# Patient Record
Sex: Male | Born: 1970 | Race: White | Hispanic: No | Marital: Married | Smoking: Never smoker
Health system: Southern US, Community
[De-identification: ages and names within clinical notes are randomized; demographics above are authoritative.]

## PROBLEM LIST (undated history)

## (undated) DIAGNOSIS — I10 Essential (primary) hypertension: Secondary | ICD-10-CM

## (undated) HISTORY — PX: FRACTURE SURGERY: SHX138

---

## 2020-10-17 DIAGNOSIS — I6389 Other cerebral infarction: Secondary | ICD-10-CM

## 2020-10-18 DIAGNOSIS — R001 Bradycardia, unspecified: Secondary | ICD-10-CM

## 2020-10-19 ENCOUNTER — Ambulatory Visit
Admission: RE | Admit: 2020-10-19 | Discharge: 2020-10-19 | Disposition: A | Discharge status: Home / Self Care | Payer: Self-pay | Source: Ambulatory Visit | Attending: Internal Medicine | Admitting: Internal Medicine

## 2020-10-19 ENCOUNTER — Other Ambulatory Visit: Payer: Self-pay | Admitting: Radiation Therapy

## 2020-10-19 DIAGNOSIS — C729 Malignant neoplasm of central nervous system, unspecified: Secondary | ICD-10-CM

## 2020-10-22 ENCOUNTER — Telehealth: Payer: Self-pay | Admitting: Radiation Therapy

## 2020-10-22 NOTE — Telephone Encounter (Signed)
Called regarding our conference discussion this morning. Mr. Joshua Swanson voicemail was full, but I was able to speak with his wife Joshua Swanson about her husband's appointment with Dr. Mickeal Skinner tomorrow afternoon.   She has requested financial assistance and is concerned that their insurance is not accepted by Dr. Mickeal Skinner, [employer].   Mont Dutton R.T.(R)(T) Radiation Special Procedures Navigator

## 2020-10-24 ENCOUNTER — Other Ambulatory Visit: Payer: Self-pay

## 2020-10-24 DIAGNOSIS — U071 COVID-19: Secondary | ICD-10-CM

## 2020-10-25 ENCOUNTER — Inpatient Hospital Stay: Payer: 59 | Attending: Internal Medicine | Admitting: Internal Medicine

## 2020-10-25 ENCOUNTER — Ambulatory Visit (INDEPENDENT_AMBULATORY_CARE_PROVIDER_SITE_OTHER): Payer: 59

## 2020-10-25 DIAGNOSIS — G9389 Other specified disorders of brain: Secondary | ICD-10-CM | POA: Diagnosis not present

## 2020-10-25 DIAGNOSIS — U071 COVID-19: Secondary | ICD-10-CM | POA: Diagnosis not present

## 2020-10-25 MED ORDER — FAMOTIDINE IN NACL 20-0.9 MG/50ML-% IV SOLN: 20.0000 mg | Freq: Once | INTRAVENOUS | Status: AC | PRN

## 2020-10-25 MED ORDER — DIPHENHYDRAMINE HCL 50 MG/ML IJ SOLN: 50.0000 mg | Freq: Once | INTRAMUSCULAR | Status: AC | PRN

## 2020-10-25 MED ORDER — METHYLPREDNISOLONE SODIUM SUCC 125 MG IJ SOLR: 125.0000 mg | Freq: Once | INTRAMUSCULAR | Status: AC | PRN

## 2020-10-25 MED ORDER — ALBUTEROL SULFATE HFA 108 (90 BASE) MCG/ACT IN AERS: 2.0000 | INHALATION_SPRAY | Freq: Once | RESPIRATORY_TRACT | Status: AC | PRN

## 2020-10-25 MED ORDER — SODIUM CHLORIDE 0.9 % IV SOLN: INTRAVENOUS | Status: AC | PRN

## 2020-10-25 MED ORDER — BEBTELOVIMAB 175 MG/2 ML IV (EUA)
175.0000 mg | Freq: Once | INTRAMUSCULAR | Status: AC
  Administered 2020-10-25: 175 mg via INTRAVENOUS

## 2020-10-25 MED ORDER — EPINEPHRINE 0.3 MG/0.3ML IJ SOAJ: 0.3000 mg | Freq: Once | INTRAMUSCULAR | Status: AC | PRN

## 2020-10-25 NOTE — Patient Instructions (Signed)

## 2020-10-25 NOTE — Progress Notes (Signed)
I connected with Joshua Swanson on 10/25/20 at  2:00 PM EDT by telephone visit and verified that I am speaking with the correct person using two identifiers.  I discussed the limitations, risks, security and privacy concerns of performing an evaluation and management service by telemedicine and the availability of in-person appointments. I also discussed with the patient that there may be a patient responsible charge related to this service. The patient expressed understanding and agreed to proceed.  Other persons participating in the visit and their role in the encounter:  spouse  Patient's location:  Home  Provider's location:  Office   Chief Complaint:  Brain mass  History of Present Ilness: Joshua Swanson presented to today's visit via phone because of recent COVID diagnosis (mild symptoms).  He describes 2-3 weeks history of right sided numbness affecting his face, arm, and leg.  He describes no issues with gait, speech, no headaches or seizures.  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  Observations: Language and cognition normal  Assessment and Plan: Brain mass  We discussed case this week in brain tumor board, recommended biopsy over resection or deferral of surgery.  He is in agreement with this plan, and referral will be made for consultation with Dr. Zada Finders.  Will defer any other intevention, including corticosteroids, for time being given lack of motor symptoms.  Follow Up Instructions: Will follow up with him following biopsy, pathology results  I discussed the assessment and treatment plan with the patient.  The patient was provided an opportunity to ask questions and all were answered.  The patient agreed with the plan and demonstrated understanding of the instructions.    The patient was advised to call back or seek an in-person evaluation if the symptoms worsen or if the condition fails to improve as  anticipated.  I provided 5-10 minutes of non-face-to-face time during this enocunter.  Ventura Sellers, MD   I provided 22 minutes of non face-to-face telephone visit time during this encounter, and > 50% was spent counseling as documented under my assessment & plan.

## 2020-10-25 NOTE — Progress Notes (Signed)
Diagnosis: COVID  Provider:  Marshell Garfinkel, MD  Procedure: Infusion  IV Type: Peripheral, IV Location: L Antecubital  Bebtelovimab, Dose: 175 mg  Infusion Start Time: [id number]  Infusion Stop Time: [id number]  Post Infusion IV Care: Observation period completed  Discharge: Condition: Good, Destination: Home . AVS provided to patient.   Performed by:  Paul Dykes, RN

## 2020-11-06 ENCOUNTER — Other Ambulatory Visit: Payer: Self-pay | Admitting: Neurological Surgery

## 2020-11-21 NOTE — Progress Notes (Signed)
Surgical Instructions    Your procedure is scheduled on November 27, 2020.  Report to [facility] Main Entrance "A" at 05:30 A.M., then check in with the Admitting office.  Call this number if you have problems the morning of surgery:  [phone]   If you have any questions prior to your surgery date call [phone]: Open Monday-Friday 8am-4pm   Remember:  Do not eat after midnight the night before your surgery  You may drink clear liquids until 04:30 am the morning of your surgery.   Clear liquids allowed are: Water, Non-Citrus Juices (without pulp), Carbonated Beverages, Clear Tea, Black Coffee ONLY (NO MILK, CREAM OR POWDERED CREAMER of any kind), and Gatorade    Take these medicines the morning of surgery with A SIP OF WATER: NONE  As of today, STOP taking any Aspirin (unless otherwise instructed by your surgeon) Aleve, Naproxen, Ibuprofen, Motrin, Advil, Goody's, BC's, all herbal medications, fish oil, and all vitamins.          Do not wear jewelry Do not wear lotions, powders,colognes, or deodorant. Do not shave 48 hours prior to surgery.  Men may shave face and neck. Do not bring valuables to the hospital.             [facility] is not responsible for any belongings or valuables.  Do NOT Smoke (Tobacco/Vaping)  24 hours prior to your procedure If you use a CPAP at night, you may bring your mask for your overnight stay.   Contacts, glasses, dentures or bridgework may not be worn into surgery, please bring cases for these belongings   For patients admitted to the hospital, discharge time will be determined by your treatment team.   Patients discharged the day of surgery will not be allowed to drive home, and someone needs to stay with them for 24 hours.  NO VISITORS WILL BE ALLOWED IN PRE-OP WHERE PATIENTS GET READY FOR SURGERY.  ONLY 1 SUPPORT PERSON MAY BE PRESENT WHILE YOU ARE IN SURGERY.  IF YOU ARE TO BE ADMITTED, ONCE YOU ARE IN YOUR ROOM YOU WILL BE ALLOWED TWO  (2) VISITORS.  Minor children may have two parents present. Special consideration for safety and communication needs will be reviewed on a case by case basis.  Special instructions:    Oral Hygiene is also important to reduce your risk of infection.  Remember - BRUSH YOUR TEETH THE MORNING OF SURGERY WITH YOUR REGULAR TOOTHPASTE   Joshua Swanson- Preparing For Surgery  Before surgery, you can play an important role. Because skin is not sterile, your skin needs to be as free of germs as possible. You can reduce the number of germs on your skin by washing with CHG (chlorahexidine gluconate) Soap before surgery.  CHG is an antiseptic cleaner which kills germs and bonds with the skin to continue killing germs even after washing.     Please do not use if you have an allergy to CHG or antibacterial soaps. If your skin becomes reddened/irritated stop using the CHG.  Do not shave (including legs and underarms) for at least 48 hours prior to first CHG shower. It is OK to shave your face.  Please follow these instructions carefully.     Shower the NIGHT BEFORE SURGERY and the MORNING OF SURGERY with CHG Soap.   If you chose to wash your hair, wash your hair first as usual with your normal shampoo. After you shampoo, rinse your hair and body thoroughly to remove the shampoo.  Then [employer] and genitals (private parts) with your normal soap and rinse thoroughly to remove soap.  After that Use CHG Soap as you would any other liquid soap. You can apply CHG directly to the skin and wash gently with a scrungie or a clean washcloth.   Apply the CHG Soap to your body ONLY FROM THE NECK DOWN.  Do not use on open wounds or open sores. Avoid contact with your eyes, ears, mouth and genitals (private parts). Wash Face and genitals (private parts)  with your normal soap.   Wash thoroughly, paying special attention to the area where your surgery will be performed.  Thoroughly rinse your body with warm water from the  neck down.  DO NOT shower/wash with your normal soap after using and rinsing off the CHG Soap.  Pat yourself dry with a CLEAN TOWEL.  Wear CLEAN PAJAMAS to bed the night before surgery  Place CLEAN SHEETS on your bed the night before your surgery  DO NOT SLEEP WITH PETS.   Day of Surgery:  Take a shower with CHG soap. Wear Clean/Comfortable clothing the morning of surgery Do not apply any deodorants/lotions.   Remember to brush your teeth WITH YOUR REGULAR TOOTHPASTE.   Please read over the following fact sheets that you were given.

## 2020-11-22 ENCOUNTER — Encounter (HOSPITAL_COMMUNITY): Payer: Self-pay

## 2020-11-22 ENCOUNTER — Encounter (HOSPITAL_COMMUNITY)
Admission: RE | Admit: 2020-11-22 | Discharge: 2020-11-22 | Disposition: A | Discharge status: Home / Self Care | Payer: 59 | Source: Ambulatory Visit | Attending: Neurological Surgery | Admitting: Neurological Surgery

## 2020-11-22 ENCOUNTER — Other Ambulatory Visit: Payer: Self-pay

## 2020-11-22 DIAGNOSIS — Z01818 Encounter for other preprocedural examination: Secondary | ICD-10-CM | POA: Insufficient documentation

## 2020-11-22 HISTORY — DX: Essential (primary) hypertension: I10

## 2020-11-22 LAB — CBC
HCT: 43.7 % (ref 39.0–52.0)
Hemoglobin: 15.3 g/dL (ref 13.0–17.0)
MCH: 31.4 pg (ref 26.0–34.0)
MCHC: 35 g/dL (ref 30.0–36.0)
MCV: 89.5 fL (ref 80.0–100.0)
Platelets: 221 10*3/uL (ref 150–400)
RBC: 4.88 MIL/uL (ref 4.22–5.81)
RDW: 12.5 % (ref 11.5–15.5)
WBC: 7.6 10*3/uL (ref 4.0–10.5)
nRBC: 0 % (ref 0.0–0.2)

## 2020-11-22 LAB — BASIC METABOLIC PANEL
Anion gap: 9 (ref 5–15)
BUN: 10 mg/dL (ref 6–20)
CO2: 22 mmol/L (ref 22–32)
Calcium: 9.3 mg/dL (ref 8.9–10.3)
Chloride: 106 mmol/L (ref 98–111)
Creatinine, Ser: 0.89 mg/dL (ref 0.61–1.24)
GFR, Estimated: >60 mL/min (ref >60)
Glucose, Bld: 109 mg/dL — ABNORMAL HIGH (ref 70–99)
Potassium: 3.8 mmol/L (ref 3.5–5.1)
Sodium: 137 mmol/L (ref 135–145)

## 2020-11-22 LAB — TYPE AND SCREEN
ABO/RH(D): A POS
Antibody Screen: NEGATIVE

## 2020-11-22 NOTE — Progress Notes (Addendum)
PCP - Walker Shadow MD, [facility]. Pt has not seen this doctor yet ,but is scheduled to see him tomorrow.  Cardiologist - none  PPM/ICD -denies Device Orders -  Rep Notified -   Chest x-ray - none EKG - 11/22/20 Stress Test - none ECHO - none Cardiac Cath -none   Sleep Study - no CPAP - no  Fasting Blood Sugar - n/a Checks Blood Sugar _____ times a day  Blood Thinner Instructions:n/a Aspirin Instructions:n/a  ERAS Protcol -yes-clear liquids until 0430 PRE-SURGERY Ensure or G2- no.  COVID TEST- no. Pt tested positive 10/23/20. He was treated at [facility] infusion clinic. See progress notes from 8/24 and 8/25.   Anesthesia review: no  Patient denies shortness of breath, fever, cough and chest pain at PAT appointment   All instructions explained to the patient, with a verbal understanding of the material. Patient agrees to go over the instructions while at home for a better understanding. Patient also instructed to self quarantine after being tested for COVID-19. The opportunity to ask questions was provided.

## 2020-11-26 NOTE — Anesthesia Preprocedure Evaluation (Addendum)
Anesthesia Evaluation  Patient identified by MRN, date of birth, ID band Patient awake    Reviewed: Allergy & Precautions, NPO status , Patient's Chart, lab work & pertinent test results  Airway Mallampati: II  TM Distance: >3 FB Neck ROM: Full    Dental  (+) Dental Advisory Given, Edentulous Upper, Edentulous Lower   Pulmonary neg pulmonary ROS,    Pulmonary exam normal breath sounds clear to auscultation       Cardiovascular hypertension, Pt. on medications negative cardio ROS Normal cardiovascular exam Rhythm:Regular Rate:Normal     Neuro/Psych negative neurological ROS     GI/Hepatic negative GI ROS, Neg liver ROS,   Endo/Other  negative endocrine ROS  Renal/GU negative Renal ROS     Musculoskeletal negative musculoskeletal ROS (+)   Abdominal   Peds  Hematology negative hematology ROS (+)   Anesthesia Other Findings   Reproductive/Obstetrics                            Anesthesia Physical Anesthesia Plan  ASA: 2  Anesthesia Plan: General   Post-op Pain Management:    Induction: Intravenous  PONV Risk Score and Plan: 3 and Ondansetron, Aprepitant, Dexamethasone, Midazolam, Propofol infusion and Treatment may vary due to age or medical condition  Airway Management Planned: Oral ETT  Additional Equipment: Arterial line  Intra-op Plan:   Post-operative Plan: Extubation in OR  Informed Consent: I have reviewed the patients History and Physical, chart, labs and discussed the procedure including the risks, benefits and alternatives for the proposed anesthesia with the patient or authorized representative who has indicated his/her understanding and acceptance.     Dental advisory given  Plan Discussed with: CRNA  Anesthesia Plan Comments: (2 x PIV, remi gtt)       Anesthesia Quick Evaluation

## 2020-11-27 ENCOUNTER — Other Ambulatory Visit: Payer: Self-pay

## 2020-11-27 ENCOUNTER — Inpatient Hospital Stay (HOSPITAL_COMMUNITY): Payer: 59 | Admitting: Certified Registered"

## 2020-11-27 ENCOUNTER — Encounter (HOSPITAL_COMMUNITY)
Admission: RE | Disposition: A | Discharge status: Home / Self Care | Payer: Self-pay | Source: Home / Self Care | Attending: Neurological Surgery

## 2020-11-27 ENCOUNTER — Encounter (HOSPITAL_COMMUNITY): Payer: Self-pay | Admitting: Neurological Surgery

## 2020-11-27 ENCOUNTER — Inpatient Hospital Stay (HOSPITAL_COMMUNITY)
Admission: RE | Admit: 2020-11-27 | Discharge: 2020-12-01 | DRG: 024 | Disposition: A | Discharge status: Home / Self Care | Payer: 59 | Attending: Neurological Surgery | Admitting: Neurological Surgery

## 2020-11-27 DIAGNOSIS — R200 Anesthesia of skin: Secondary | ICD-10-CM | POA: Diagnosis present

## 2020-11-27 DIAGNOSIS — I10 Essential (primary) hypertension: Secondary | ICD-10-CM | POA: Diagnosis present

## 2020-11-27 DIAGNOSIS — D496 Neoplasm of unspecified behavior of brain: Secondary | ICD-10-CM | POA: Diagnosis present

## 2020-11-27 DIAGNOSIS — G0481 Other encephalitis and encephalomyelitis: Secondary | ICD-10-CM | POA: Diagnosis present

## 2020-11-27 DIAGNOSIS — R270 Ataxia, unspecified: Secondary | ICD-10-CM | POA: Diagnosis present

## 2020-11-27 HISTORY — PX: APPLICATION OF CRANIAL NAVIGATION: SHX6578

## 2020-11-27 HISTORY — PX: CRANIOTOMY: SHX93

## 2020-11-27 LAB — CBC
HCT: 37.6 % — ABNORMAL LOW (ref 39.0–52.0)
Hemoglobin: 13.5 g/dL (ref 13.0–17.0)
MCH: 31.7 pg (ref 26.0–34.0)
MCHC: 35.9 g/dL (ref 30.0–36.0)
MCV: 88.3 fL (ref 80.0–100.0)
Platelets: 215 10*3/uL (ref 150–400)
RBC: 4.26 MIL/uL (ref 4.22–5.81)
RDW: 12.3 % (ref 11.5–15.5)
WBC: 9.7 10*3/uL (ref 4.0–10.5)
nRBC: 0 % (ref 0.0–0.2)

## 2020-11-27 LAB — ABO/RH: ABO/RH(D): A POS

## 2020-11-27 LAB — CREATININE, SERUM
Creatinine, Ser: 0.96 mg/dL (ref 0.61–1.24)
GFR, Estimated: >60 mL/min (ref >60)

## 2020-11-27 LAB — MRSA NEXT GEN BY PCR, NASAL: MRSA by PCR Next Gen: NOT DETECTED

## 2020-11-27 SURGERY — CRANIOTOMY TUMOR EXCISION
Anesthesia: General | Site: Head

## 2020-11-27 MED ORDER — PROPOFOL 500 MG/50ML IV EMUL
INTRAVENOUS | Status: DC | PRN
  Administered 2020-11-27: 75 ug/kg/min via INTRAVENOUS
  Administered 2020-11-27: 50 ug/kg/min via INTRAVENOUS

## 2020-11-27 MED ORDER — HEMOSTATIC AGENTS (NO CHARGE) OPTIME
TOPICAL | Status: DC | PRN
  Administered 2020-11-27: 1 via TOPICAL

## 2020-11-27 MED ORDER — BACITRACIN ZINC 500 UNIT/GM EX OINT
TOPICAL_OINTMENT | CUTANEOUS | Status: DC | PRN
  Administered 2020-11-27: 1 via TOPICAL

## 2020-11-27 MED ORDER — CEFAZOLIN SODIUM-DEXTROSE 2-4 GM/100ML-% IV SOLN
2.0000 g | Freq: Three times a day (TID) | INTRAVENOUS | Status: AC
  Administered 2020-11-27 – 2020-11-28 (×2): 2 g via INTRAVENOUS
  Filled 2020-11-27 (×2): qty 100

## 2020-11-27 MED ORDER — DEXAMETHASONE SODIUM PHOSPHATE 10 MG/ML IJ SOLN
INTRAMUSCULAR | Status: DC | PRN
  Administered 2020-11-27: 10 mg via INTRAVENOUS

## 2020-11-27 MED ORDER — EPHEDRINE SULFATE-NACL 50-0.9 MG/10ML-% IV SOSY
PREFILLED_SYRINGE | INTRAVENOUS | Status: DC | PRN
  Administered 2020-11-27: 5 mg via INTRAVENOUS

## 2020-11-27 MED ORDER — 0.9 % SODIUM CHLORIDE (POUR BTL) OPTIME
TOPICAL | Status: DC | PRN
  Administered 2020-11-27: 3000 mL

## 2020-11-27 MED ORDER — HYDRALAZINE HCL 20 MG/ML IJ SOLN
5.0000 mg | INTRAMUSCULAR | Status: DC | PRN
  Administered 2020-11-27: 5 mg via INTRAVENOUS
  Filled 2020-11-27: qty 1

## 2020-11-27 MED ORDER — THROMBIN 20000 UNITS EX SOLR: CUTANEOUS | Status: DC | PRN

## 2020-11-27 MED ORDER — THROMBIN 5000 UNITS EX SOLR
CUTANEOUS | Status: AC
  Filled 2020-11-27: qty 5000

## 2020-11-27 MED ORDER — LIDOCAINE-EPINEPHRINE 1 %-1:100000 IJ SOLN
INTRAMUSCULAR | Status: DC | PRN
  Administered 2020-11-27: 10 mL

## 2020-11-27 MED ORDER — OXYCODONE HCL 5 MG PO TABS
10.0000 mg | ORAL_TABLET | ORAL | Status: DC | PRN
  Administered 2020-11-28 – 2020-11-30 (×5): 10 mg via ORAL
  Filled 2020-11-27 (×5): qty 2

## 2020-11-27 MED ORDER — LIDOCAINE 2% (20 MG/ML) 5 ML SYRINGE
INTRAMUSCULAR | Status: DC | PRN
  Administered 2020-11-27: 50 mg via INTRAVENOUS

## 2020-11-27 MED ORDER — ORAL CARE MOUTH RINSE: 15.0000 mL | Freq: Once | OROMUCOSAL | Status: AC

## 2020-11-27 MED ORDER — CEFAZOLIN SODIUM 1 G IJ SOLR
INTRAMUSCULAR | Status: AC
  Filled 2020-11-27: qty 20

## 2020-11-27 MED ORDER — SUCCINYLCHOLINE CHLORIDE 200 MG/10ML IV SOSY
PREFILLED_SYRINGE | INTRAVENOUS | Status: DC | PRN
  Administered 2020-11-27: 100 mg via INTRAVENOUS

## 2020-11-27 MED ORDER — HYDRALAZINE HCL 20 MG/ML IJ SOLN: 10.0000 mg | INTRAMUSCULAR | Status: DC | PRN

## 2020-11-27 MED ORDER — SODIUM CHLORIDE 0.9 % IV SOLN: INTRAVENOUS | Status: DC | PRN

## 2020-11-27 MED ORDER — MIDAZOLAM HCL 2 MG/2ML IJ SOLN
INTRAMUSCULAR | Status: AC
  Filled 2020-11-27: qty 2

## 2020-11-27 MED ORDER — SODIUM CHLORIDE 0.9 % IV SOLN
0.0500 ug/kg/min | INTRAVENOUS | Status: AC
  Administered 2020-11-27: .2 ug/kg/min via INTRAVENOUS
  Filled 2020-11-27: qty 5000

## 2020-11-27 MED ORDER — AMISULPRIDE (ANTIEMETIC) 5 MG/2ML IV SOLN: 10.0000 mg | Freq: Once | INTRAVENOUS | Status: DC | PRN

## 2020-11-27 MED ORDER — PHENYLEPHRINE 40 MCG/ML (10ML) SYRINGE FOR IV PUSH (FOR BLOOD PRESSURE SUPPORT)
PREFILLED_SYRINGE | INTRAVENOUS | Status: DC | PRN
  Administered 2020-11-27: 40 ug via INTRAVENOUS
  Administered 2020-11-27: 80 ug via INTRAVENOUS

## 2020-11-27 MED ORDER — PHENYLEPHRINE HCL-NACL 20-0.9 MG/250ML-% IV SOLN
INTRAVENOUS | Status: DC | PRN
  Administered 2020-11-27: 25 ug/min via INTRAVENOUS

## 2020-11-27 MED ORDER — CHLORHEXIDINE GLUCONATE 0.12 % MT SOLN
15.0000 mL | Freq: Once | OROMUCOSAL | Status: AC
  Administered 2020-11-27: 15 mL via OROMUCOSAL
  Filled 2020-11-27: qty 15

## 2020-11-27 MED ORDER — ACETAMINOPHEN 325 MG PO TABS
650.0000 mg | ORAL_TABLET | ORAL | Status: DC | PRN
  Administered 2020-11-27: 650 mg via ORAL
  Filled 2020-11-27: qty 2

## 2020-11-27 MED ORDER — HYDROMORPHONE HCL 1 MG/ML IJ SOLN: 0.2500 mg | INTRAMUSCULAR | Status: DC | PRN

## 2020-11-27 MED ORDER — MEPERIDINE HCL 25 MG/ML IJ SOLN: 6.2500 mg | INTRAMUSCULAR | Status: DC | PRN

## 2020-11-27 MED ORDER — GLYCOPYRROLATE PF 0.2 MG/ML IJ SOSY
PREFILLED_SYRINGE | INTRAMUSCULAR | Status: AC
  Filled 2020-11-27: qty 1

## 2020-11-27 MED ORDER — LACTATED RINGERS IV SOLN: INTRAVENOUS | Status: DC

## 2020-11-27 MED ORDER — THROMBIN 20000 UNITS EX SOLR
CUTANEOUS | Status: AC
  Filled 2020-11-27: qty 20000

## 2020-11-27 MED ORDER — DOCUSATE SODIUM 100 MG PO CAPS
100.0000 mg | ORAL_CAPSULE | Freq: Two times a day (BID) | ORAL | Status: DC
  Administered 2020-11-27 – 2020-12-01 (×9): 100 mg via ORAL
  Filled 2020-11-27 (×7): qty 1

## 2020-11-27 MED ORDER — LIDOCAINE-EPINEPHRINE 1 %-1:100000 IJ SOLN
INTRAMUSCULAR | Status: AC
  Filled 2020-11-27: qty 1

## 2020-11-27 MED ORDER — THROMBIN 5000 UNITS EX SOLR: OROMUCOSAL | Status: DC | PRN

## 2020-11-27 MED ORDER — ONDANSETRON HCL 4 MG/2ML IJ SOLN
4.0000 mg | INTRAMUSCULAR | Status: DC | PRN
  Administered 2020-11-28: 4 mg via INTRAVENOUS
  Filled 2020-11-27: qty 2

## 2020-11-27 MED ORDER — APREPITANT 40 MG PO CAPS
40.0000 mg | ORAL_CAPSULE | Freq: Once | ORAL | Status: AC
  Administered 2020-11-27: 40 mg via ORAL
  Filled 2020-11-27: qty 1

## 2020-11-27 MED ORDER — PROPOFOL 10 MG/ML IV BOLUS
INTRAVENOUS | Status: AC
  Filled 2020-11-27: qty 40

## 2020-11-27 MED ORDER — ACETAMINOPHEN 650 MG RE SUPP: 650.0000 mg | RECTAL | Status: DC | PRN

## 2020-11-27 MED ORDER — CEFAZOLIN SODIUM-DEXTROSE 2-4 GM/100ML-% IV SOLN
2.0000 g | INTRAVENOUS | Status: AC
  Administered 2020-11-27 (×2): 2 g via INTRAVENOUS
  Filled 2020-11-27: qty 100

## 2020-11-27 MED ORDER — POLYETHYLENE GLYCOL 3350 17 G PO PACK: 17.0000 g | PACK | Freq: Every day | ORAL | Status: DC | PRN

## 2020-11-27 MED ORDER — MANNITOL 25 % IV SOLN
INTRAVENOUS | Status: DC | PRN
  Administered 2020-11-27: 37.5 g via INTRAVENOUS

## 2020-11-27 MED ORDER — LOSARTAN POTASSIUM 50 MG PO TABS
50.0000 mg | ORAL_TABLET | Freq: Every day | ORAL | Status: DC
  Administered 2020-11-27 – 2020-12-01 (×5): 50 mg via ORAL
  Filled 2020-11-27 (×5): qty 1

## 2020-11-27 MED ORDER — FENTANYL CITRATE (PF) 250 MCG/5ML IJ SOLN
INTRAMUSCULAR | Status: DC | PRN
  Administered 2020-11-27 (×2): 50 ug via INTRAVENOUS

## 2020-11-27 MED ORDER — CHLORHEXIDINE GLUCONATE CLOTH 2 % EX PADS
6.0000 | MEDICATED_PAD | Freq: Every day | CUTANEOUS | Status: DC
  Administered 2020-11-29: 6 via TOPICAL

## 2020-11-27 MED ORDER — ONDANSETRON HCL 4 MG PO TABS: 4.0000 mg | ORAL_TABLET | ORAL | Status: DC | PRN

## 2020-11-27 MED ORDER — PROPOFOL 10 MG/ML IV BOLUS
INTRAVENOUS | Status: DC | PRN
  Administered 2020-11-27: 40 mg via INTRAVENOUS
  Administered 2020-11-27: 160 mg via INTRAVENOUS
  Administered 2020-11-27: 40 mg via INTRAVENOUS
  Administered 2020-11-27: 50 mg via INTRAVENOUS
  Administered 2020-11-27: 60 mg via INTRAVENOUS

## 2020-11-27 MED ORDER — CHLORHEXIDINE GLUCONATE CLOTH 2 % EX PADS: 6.0000 | MEDICATED_PAD | Freq: Once | CUTANEOUS | Status: DC

## 2020-11-27 MED ORDER — FENTANYL CITRATE (PF) 250 MCG/5ML IJ SOLN
INTRAMUSCULAR | Status: AC
  Filled 2020-11-27: qty 5

## 2020-11-27 MED ORDER — LIDOCAINE HCL (PF) 2 % IJ SOLN
INTRAMUSCULAR | Status: AC
  Filled 2020-11-27: qty 5

## 2020-11-27 MED ORDER — PROMETHAZINE HCL 25 MG PO TABS
12.5000 mg | ORAL_TABLET | ORAL | Status: DC | PRN
  Filled 2020-11-27: qty 1

## 2020-11-27 MED ORDER — OXYCODONE HCL 5 MG PO TABS
5.0000 mg | ORAL_TABLET | ORAL | Status: DC | PRN
  Administered 2020-11-27 – 2020-12-01 (×10): 5 mg via ORAL
  Filled 2020-11-27 (×10): qty 1

## 2020-11-27 MED ORDER — LABETALOL HCL 5 MG/ML IV SOLN
10.0000 mg | INTRAVENOUS | Status: DC | PRN
  Administered 2020-11-27 – 2020-11-29 (×3): 20 mg via INTRAVENOUS
  Filled 2020-11-27: qty 8
  Filled 2020-11-27: qty 4

## 2020-11-27 MED ORDER — PROPOFOL 1000 MG/100ML IV EMUL
INTRAVENOUS | Status: AC
  Filled 2020-11-27: qty 200

## 2020-11-27 MED ORDER — ONDANSETRON HCL 4 MG/2ML IJ SOLN
INTRAMUSCULAR | Status: DC | PRN
  Administered 2020-11-27: 4 mg via INTRAVENOUS

## 2020-11-27 MED ORDER — HYDROMORPHONE HCL 1 MG/ML IJ SOLN
0.5000 mg | INTRAMUSCULAR | Status: DC | PRN
  Administered 2020-11-27 – 2020-11-28 (×3): 0.5 mg via INTRAVENOUS
  Filled 2020-11-27 (×3): qty 1

## 2020-11-27 MED ORDER — BACITRACIN ZINC 500 UNIT/GM EX OINT
TOPICAL_OINTMENT | CUTANEOUS | Status: AC
  Filled 2020-11-27: qty 28.35

## 2020-11-27 MED ORDER — HEPARIN SODIUM (PORCINE) 5000 UNIT/ML IJ SOLN
5000.0000 [IU] | Freq: Three times a day (TID) | INTRAMUSCULAR | Status: DC
  Administered 2020-11-29 – 2020-12-01 (×7): 5000 [IU] via SUBCUTANEOUS
  Filled 2020-11-27 (×6): qty 1

## 2020-11-27 MED ORDER — GLYCOPYRROLATE PF 0.2 MG/ML IJ SOSY
PREFILLED_SYRINGE | INTRAMUSCULAR | Status: DC | PRN
  Administered 2020-11-27 (×2): .2 mg via INTRAVENOUS

## 2020-11-27 SURGICAL SUPPLY — 69 items
BAG COUNTER SPONGE SURGICOUNT (BAG) ×3 IMPLANT
BAND RUBBER #18 3X1/16 STRL (MISCELLANEOUS) ×6 IMPLANT
BNDG GAUZE ELAST 4 BULKY (GAUZE/BANDAGES/DRESSINGS) IMPLANT
BNDG STRETCH 4X75 STRL LF (GAUZE/BANDAGES/DRESSINGS) IMPLANT
BUR ACORN 9.0 PRECISION (BURR) ×3 IMPLANT
BUR ROUND FLUTED 4 SOFT TCH (BURR) ×3 IMPLANT
BUR SPIRAL ROUTER 2.3 (BUR) ×3 IMPLANT
CANISTER SUCT 3000ML PPV (MISCELLANEOUS) ×6 IMPLANT
CATH FOLEY 2WAY SLVR  5CC 12FR (CATHETERS) ×1
CATH FOLEY 2WAY SLVR 5CC 12FR (CATHETERS) ×2 IMPLANT
CNTNR URN SCR LID CUP LEK RST (MISCELLANEOUS) ×2 IMPLANT
CONT SPEC 4OZ STRL OR WHT (MISCELLANEOUS) ×1
DRAPE HALF SHEET 40X57 (DRAPES) ×3 IMPLANT
DRAPE MICROSCOPE LEICA (MISCELLANEOUS) ×3 IMPLANT
DRAPE NEUROLOGICAL W/INCISE (DRAPES) ×3 IMPLANT
DRAPE WARM FLUID 44X44 (DRAPES) ×3 IMPLANT
DURAPREP 6ML APPLICATOR 50/CS (WOUND CARE) ×3 IMPLANT
ELECT REM PT RETURN 9FT ADLT (ELECTROSURGICAL) ×3
ELECTRODE REM PT RTRN 9FT ADLT (ELECTROSURGICAL) ×2 IMPLANT
FEE INTRAOP CADWELL SUPPLY NCS (MISCELLANEOUS) ×2 IMPLANT
FEE INTRAOP MONITOR IMPULS NCS (MISCELLANEOUS) ×2 IMPLANT
FORCEPS BIPOLAR SPETZLER 8 1.0 (NEUROSURGERY SUPPLIES) ×3 IMPLANT
GAUZE 4X4 16PLY ~~LOC~~+RFID DBL (SPONGE) ×6 IMPLANT
GLOVE SS BIOGEL STRL SZ 7.5 (GLOVE) ×2 IMPLANT
GLOVE SUPERSENSE BIOGEL SZ 7.5 (GLOVE) ×1
GLOVE SURG ENC MOIS LTX SZ7 (GLOVE) ×6 IMPLANT
GLOVE SURG LTX SZ7.5 (GLOVE) ×6 IMPLANT
GLOVE SURG PR MICRO ENCORE 7.5 (GLOVE) ×6 IMPLANT
GLOVE SURG UNDER POLY LF SZ7 (GLOVE) ×6 IMPLANT
GLOVE SURG UNDER POLY LF SZ7.5 (GLOVE) ×6 IMPLANT
GOWN STRL REUS W/ TWL LRG LVL3 (GOWN DISPOSABLE) ×4 IMPLANT
GOWN STRL REUS W/ TWL XL LVL3 (GOWN DISPOSABLE) IMPLANT
GOWN STRL REUS W/TWL 2XL LVL3 (GOWN DISPOSABLE) ×6 IMPLANT
GOWN STRL REUS W/TWL LRG LVL3 (GOWN DISPOSABLE) ×2
GOWN STRL REUS W/TWL XL LVL3 (GOWN DISPOSABLE)
HEMOSTAT POWDER KIT SURGIFOAM (HEMOSTASIS) ×3 IMPLANT
HEMOSTAT SURGICEL 2X14 (HEMOSTASIS) ×3 IMPLANT
INTRAOP CADWELL SUPPLY FEE NCS (MISCELLANEOUS) ×2
INTRAOP DISP SUPPLY FEE NCS (MISCELLANEOUS) ×1
INTRAOP MONITOR FEE IMPULS NCS (MISCELLANEOUS) ×2
INTRAOP MONITOR FEE IMPULSE (MISCELLANEOUS) ×1
KIT BASIN OR (CUSTOM PROCEDURE TRAY) ×3 IMPLANT
KIT TURNOVER KIT B (KITS) ×3 IMPLANT
MARKER SPHERE PSV REFLC 13MM (MARKER) ×9 IMPLANT
NEEDLE HYPO 22GX1.5 SAFETY (NEEDLE) ×3 IMPLANT
NS IRRIG 1000ML POUR BTL (IV SOLUTION) ×9 IMPLANT
PACK CRANIOTOMY CUSTOM (CUSTOM PROCEDURE TRAY) ×3 IMPLANT
PIN MAYFIELD SKULL DISP (PIN) ×3 IMPLANT
PLATE MALL UNIV 0.3 (Plate) ×3 IMPLANT
PROBE FOR NEUROSURGERY (MISCELLANEOUS) ×3 IMPLANT
SCREW UNIII AXS SD 1.5X4 (Screw) ×15 IMPLANT
SPONGE NEURO XRAY DETECT 1X3 (DISPOSABLE) IMPLANT
SPONGE SURGIFOAM ABS GEL 100 (HEMOSTASIS) ×3 IMPLANT
STAPLER VISISTAT 35W (STAPLE) ×3 IMPLANT
SUT ETHILON 3 0 FSL (SUTURE) IMPLANT
SUT ETHILON 3 0 PS 1 (SUTURE) IMPLANT
SUT MNCRL AB 3-0 PS2 18 (SUTURE) IMPLANT
SUT MON AB 3-0 SH 27 (SUTURE) ×1
SUT MON AB 3-0 SH27 (SUTURE) ×2 IMPLANT
SUT NURALON 4 0 TR CR/8 (SUTURE) ×3 IMPLANT
SUT SILK 0 TIES 10X30 (SUTURE) IMPLANT
SUT VIC AB 2-0 CP2 18 (SUTURE) ×3 IMPLANT
TOWEL GREEN STERILE (TOWEL DISPOSABLE) ×3 IMPLANT
TOWEL GREEN STERILE FF (TOWEL DISPOSABLE) ×3 IMPLANT
TRAY FOLEY MTR SLVR 16FR STAT (SET/KITS/TRAYS/PACK) ×3 IMPLANT
TUBE CONNECTING 12X1/4 (SUCTIONS) ×3 IMPLANT
TUBE ENDOTRAC NIMS EEG 6 (NEUROSURGERY SUPPLIES) ×1
TUBE ENDOTRAC NIMS EEG 6 NCS (NEUROSURGERY SUPPLIES) ×2 IMPLANT
WATER STERILE IRR 1000ML POUR (IV SOLUTION) ×3 IMPLANT

## 2020-11-27 NOTE — Anesthesia Postprocedure Evaluation (Signed)
Anesthesia Post Note  Patient: Joshua Swanson  Procedure(s) Performed: Left Far Lateral Craniotomy for Tumor Resection (Left: Head) APPLICATION OF CRANIAL NAVIGATION     Patient location during evaluation: PACU Anesthesia Type: General Level of consciousness: sedated and patient cooperative Pain management: pain level controlled Vital Signs Assessment: post-procedure vital signs reviewed and stable Respiratory status: spontaneous breathing Cardiovascular status: stable Anesthetic complications: no   No notable events documented.  Last Vitals:  Vitals:   11/27/20 1500 11/27/20 1600  BP: (!) 154/96 (!) 152/90  Pulse: 82 73  Resp: 18 15  Temp:    SpO2: 93% 94%    Last Pain:  Vitals:   11/27/20 1600  TempSrc:   PainSc: [place]

## 2020-11-27 NOTE — Anesthesia Procedure Notes (Signed)
Procedure Name: Intubation Date/Time: 11/27/2020 7:53 AM Performed by: Gaylene Brooks, CRNA Pre-anesthesia Checklist: Patient identified, Emergency Drugs available, Suction available, Patient being monitored and Timeout performed Patient Re-evaluated:Patient Re-evaluated prior to induction Oxygen Delivery Method: Circle system utilized Preoxygenation: Pre-oxygenation with 100% oxygen Induction Type: IV induction Ventilation: Mask ventilation without difficulty Laryngoscope Size: Miller and 2 Grade View: Grade I Tube size: 7.5 mm Number of attempts: 1 Placement Confirmation: ETT inserted through vocal cords under direct vision, positive ETCO2, CO2 detector and breath sounds checked- equal and bilateral Secured at: 21 cm Dental Injury: Teeth and Oropharynx as per pre-operative assessment  Comments: NIMS tube

## 2020-11-27 NOTE — Op Note (Signed)
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 Further workup did not provide a diagnosis, I therefore recommended open biopsy via a far lateral craniotomy to obtain a diagnosis and guide treatment. We discussed the high risks of the procedure at length, especially given the location of the tumor in the brainstem. We also discussed that I would resect it if possible, but this was unlikely and the goal was to get a diagnosis, which may still be quite difficult. This was discussed with the patient as well as the usual risks, benefits, and alternatives and wished to proceed with surgery.   OPERATIVE DETAIL: The patient was taken to the operating room and placed on the OR table.  A formal time out was performed with two patient identifiers and confirmed the operative site. Anesthesia was induced by the anesthesia team. The patient was placed in a sloppy lateral position with a bean bag and taping to secure him to the OR table. The [place] head holder was applied to the head and a registration array was attached to the [place]. This was co-registered with the patient's preoperative imaging, the fit appeared to be acceptable. Using frameless stereotaxy, the operative trajectory was planned and the incision was marked. Hair was clipped with surgical clippers over the incision and the area was then prepped and draped in a sterile fashion.  A curvilinear  'lazy S' incision was used and a standard far lateral approach was performed on the left. Intra-operative doppler was used to confirm the location of the vertebral artery, which was protected. A far lateral craniectomy was created with high speed drill and rongeurs followed by a C1 hemilaminectomy. After removing a small portion of bone leading up to the condyle, the dura was incised and flapped laterally in the usual fashion.   The operating microscope was draped and brought into the field. We had been monitoring lower cranial nerves, SSEPs, and MEPs during the case and repeated them now as a new baseline. I dissected the arachnoid and created a dissection window to access the lateral medulla. I confirmed the location of the tumor with stereotactic navigation. I attempted to use a Rhoton dissector to comb in line with the fibers, but the tissue was gliotic and too firm. I therefore used a #11 blade, parallel to the fibers, and incised the medulla. I was able to dissect what appeared to be a plane ventrally, then remove a few very small pieces of tissue. Given their size and the suspected pathology, I was concerned this would not be a diagnostic sample. Monitoring was at baseline, so I removed some more in the same location. There was a significant decrement in MEPs after the second biopsy, they thankfully slowly returned. This was indicative to me that no further tissue could be removed safely. I therefore placed some floseal over the biopsy location, sent the specimens to pathology and began closure.  The wound was copiously irrigated, hemostasis was confirmed, and  the dura was closed with suture followed by a layer of gelfoam and then a titanium plate secured with titanium screws.   All instrument and sponge counts were correct, the incision was then closed in layers. The patient was then returned to anesthesia for emergence. No apparent complications at the completion of the procedure.   EBL:  220mL    DRAINS: none   SPECIMENS: Medullary brain tumor   Judith Part, MD 11/27/20 7:34 AM

## 2020-11-27 NOTE — H&P (Signed)
Surgical H&P Update  HPI: 49 y.o. man that presented with right sided numbness. This has improved somewhat with the steroids, his right upper extremity ataxia has improved but not yet back to baseline - still awkward with writing and using that hand - he's R hand dominant. 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 No changes in health since he was last seen.   PMHx:  Past Medical History:  Diagnosis Date   Hypertension    FamHx: History reviewed. No pertinent family history. SocHx:  reports that he has never smoked. He has never used smokeless tobacco. No history on file for alcohol use and drug use.  Physical Exam: AOx3, PERRL, FS, TM  Strength 5/5 x4, SILT except right face / arm / leg numbness, some RUE appendicular ataxia  Assesment/Plan: 50 y.o. man with intramedullary brainstem lesion, here for left far lateral craniotomy for biopsy and possible resection. Risks, benefits, and alternatives discussed and the patient would like to continue with surgery.  -OR today -4N ICU post-op  Judith Part, MD 11/27/20 7:13 AM

## 2020-11-27 NOTE — Anesthesia Procedure Notes (Signed)
Arterial Line Insertion Start/End9/27/2022 6:55 AM, 11/27/2020 7:10 AM Performed by: Gaylene Brooks, CRNA  Preanesthetic checklist: patient identified, IV checked, site marked, risks and benefits discussed, surgical consent, monitors and equipment checked, pre-op evaluation, timeout performed and anesthesia consent Right was placed Catheter size: 20 G Hand hygiene performed  and maximum sterile barriers used  Allen's test indicative of satisfactory collateral circulation Attempts: 1 Procedure performed without using ultrasound guided technique. Following insertion, Biopatch and dressing applied. Post procedure assessment: normal  Patient tolerated the procedure well with no immediate complications.

## 2020-11-27 NOTE — Transfer of Care (Signed)
Immediate Anesthesia Transfer of Care Note  Patient: [name]  Procedure(s) Performed: Left Far Lateral Craniotomy for Tumor Resection (Left: Head) APPLICATION OF CRANIAL NAVIGATION  Patient Location: PACU  Anesthesia Type:General  Level of Consciousness: awake and drowsy  Airway & Oxygen Therapy: Patient Spontanous Breathing and Patient connected to face mask oxygen  Post-op Assessment: Report given to RN and Post -op Vital signs reviewed and stable  Post vital signs: Reviewed and stable  Last Vitals:  Vitals Value Taken Time  BP 147/81 11/27/20 1339  Temp    Pulse 91 11/27/20 1343  Resp 25 11/27/20 1343  SpO2 97 % 11/27/20 1343  Vitals shown include unvalidated device data.  Last Pain:  Vitals:   11/27/20 0607  TempSrc:   PainSc: 0-No pain         Complications: No notable events documented.

## 2020-11-28 ENCOUNTER — Inpatient Hospital Stay (HOSPITAL_COMMUNITY): Payer: 59

## 2020-11-28 IMAGING — MR MR HEAD WO/W CM
7 of 14 series · 24 of 48 positions shown · IV contrast (gadavist)
Comparison: [DATE]

CLINICAL DATA: Brain mass.  Open biopsy.

EXAM:
MRI HEAD WITHOUT AND WITH CONTRAST
TECHNIQUE: Multiplanar, multiecho pulse sequences of the brain and surrounding
structures were obtained without and with intravenous contrast.
CONTRAST:  10mL GADAVIST GADOBUTROL 1 MMOL/ML IV SOLN

[Series 3: DWI · axial · 3.0mm · 0.94mm/px · z∈[-34,+126]mm · 8 of 110 slices shown (1 of 2)]
[im 1/110]
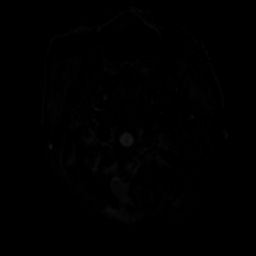
[im 16/110]
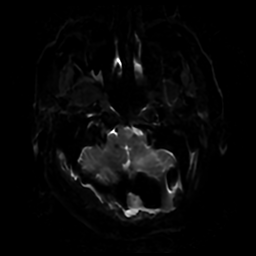
[im 32/110]
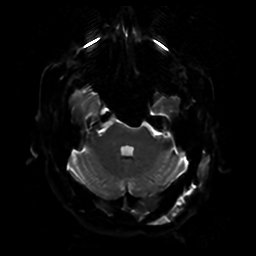
[im 47/110]
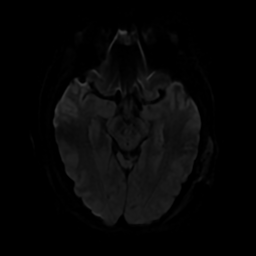
[im 63/110]
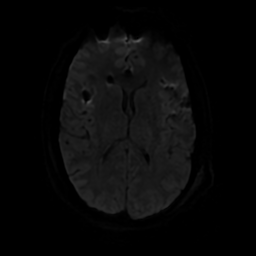
[im 78/110]
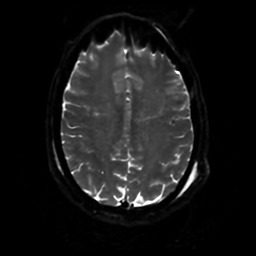
[im 94/110]
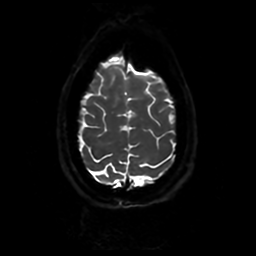
[im 110/110]
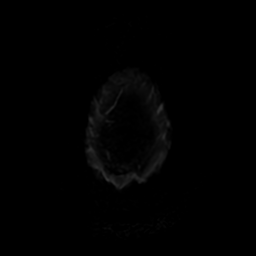

[Series 4: DWI · coronal · 4.0mm · 0.94mm/px · 5 of 78 slices shown (2 of 2)]
[im 1/78]
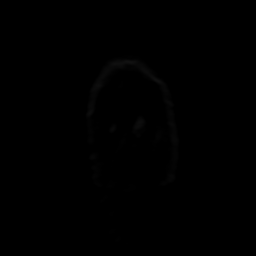
[im 20/78]
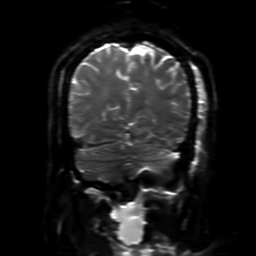
[im 39/78]
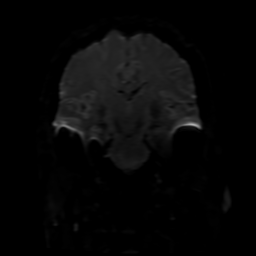
[im 58/78]
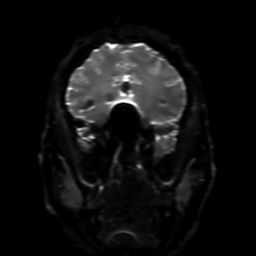
[im 78/78]
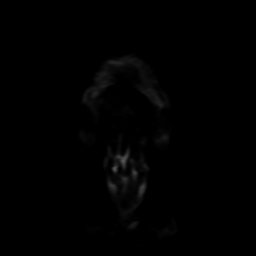

[Series 5: FLAIR · sagittal · 5.0mm · 0.23mm/px · 2 of 25 slices shown (1 of 2)]
[im 1/25]
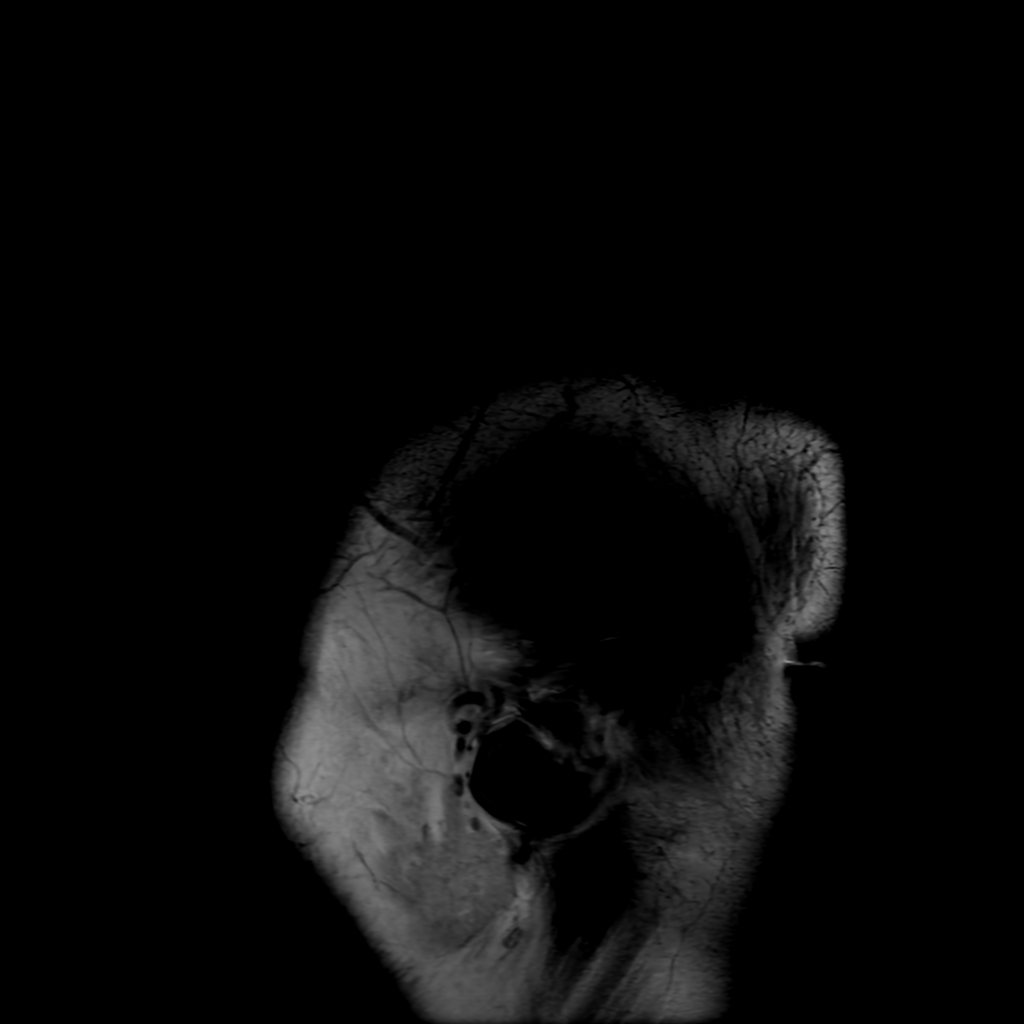
[im 25/25]
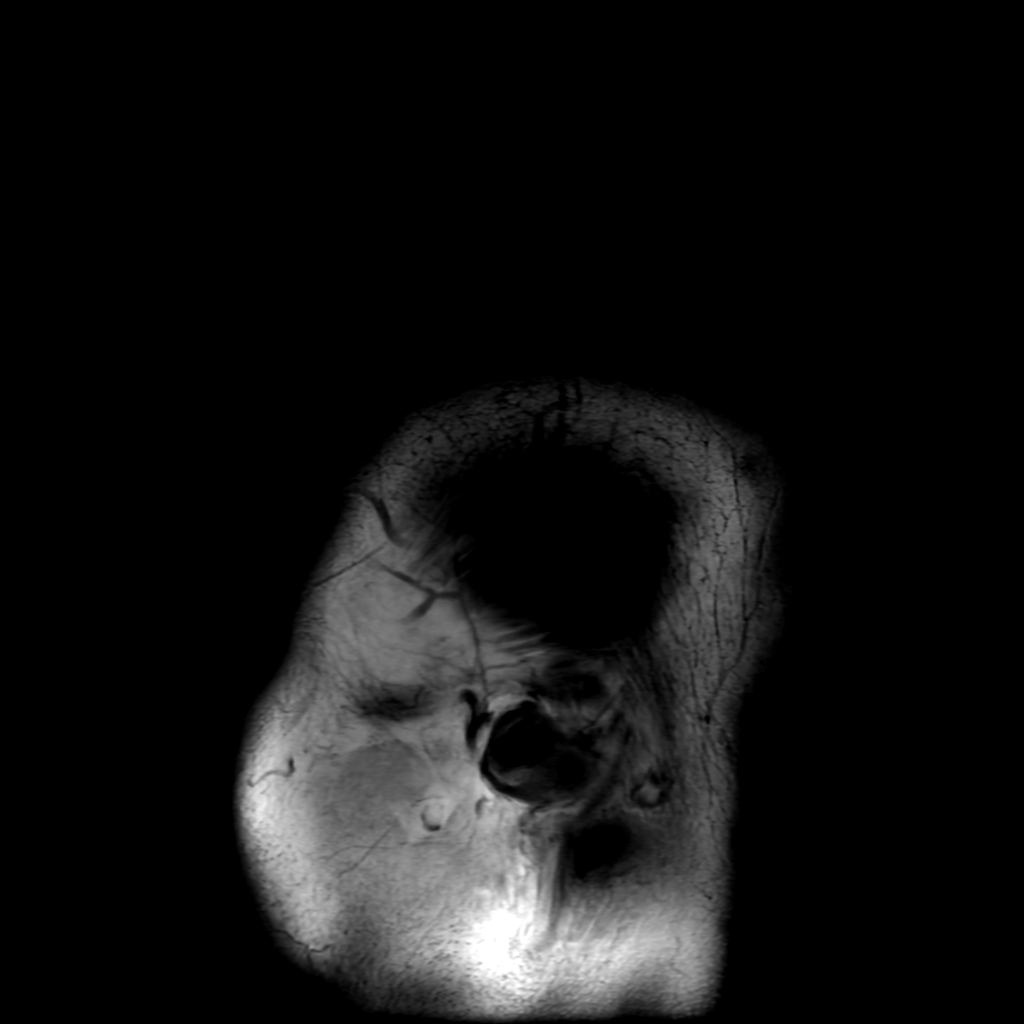

[Series 7: FLAIR · axial · 4.0mm · 0.45mm/px · z∈[-25,+130]mm · 2 of 37 slices shown (2 of 2)]
[im 1/37]
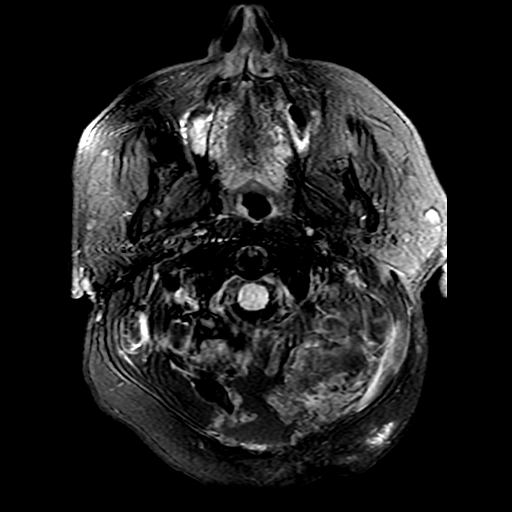
[im 37/37]
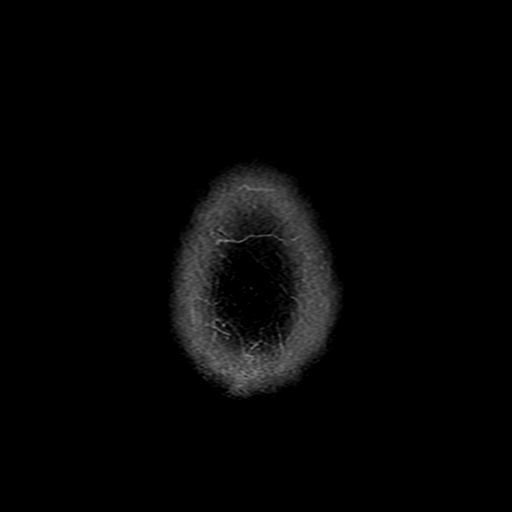

[Series 13: FLAIR post-contrast · sagittal · 5.0mm · 0.47mm/px · 2 of 27 slices shown]
[im 1/27]
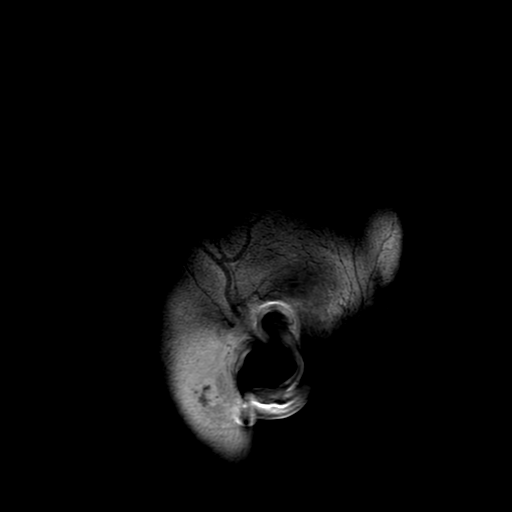
[im 27/27]
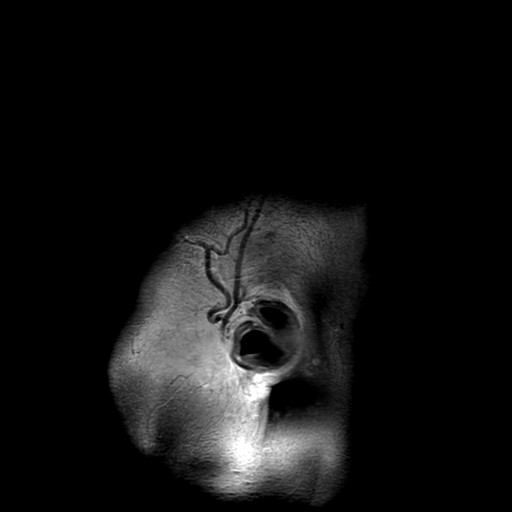

[Series 350: ADC · axial · 3.0mm · 0.94mm/px · z∈[-34,+126]mm · 3 of 55 slices shown (1 of 2)]
[im 1/55]
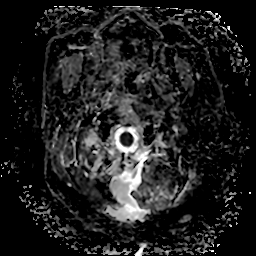
[im 28/55]
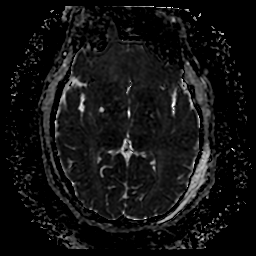
[im 55/55]
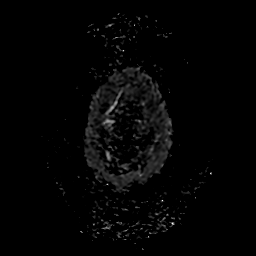

[Series 450: ADC · coronal · 4.0mm · 0.94mm/px · 2 of 39 slices shown (2 of 2)]
[im 1/39]
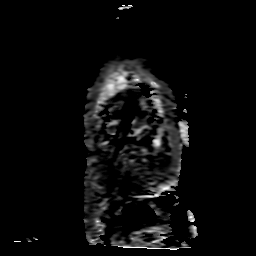
[im 39/39]
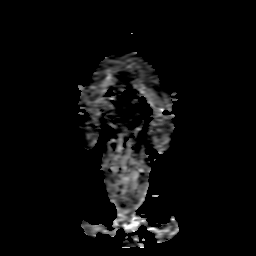

[24 of 48 positions shown; findings below may reference images not displayed]

FINDINGS: Brain:

Probable trace hemorrhage at the level of the left medullary lesion.
No complicating infarct. There has been decreased mass effect and
minimal if any enhancement, atypical for a neoplasm. An inflammatory
process (such as sarcoid), or infectious process are considered
given this unusual evolution.

Small subdural collection behind the clivus. No unexpected blood
products. Pneumocephalus along sulci and around the frontal lobes.

No hydrocephalus or brain sagging.

Dilated perivascular spaces seen in the cerebral white matter on the
right.

Vascular: Preserved flow voids and vascular enhancements, including
in the left vertebral.

Skull and upper cervical spine: Left suboccipital craniotomy with
fairly extensive adjacent fluid posterior to the bone flap.

Sinuses/Orbits: Negative
IMPRESSION: 1. Brainstem biopsy without complicating features.
2. Unexpected decrease (for neoplasm) in enhancement and swelling
since preoperative MRI, has there been any pre-surgical intervention
such as steroids? Question primary inflammatory process, even
sarcoid.

## 2020-11-28 MED ORDER — GADOBUTROL 1 MMOL/ML IV SOLN
10.0000 mL | Freq: Once | INTRAVENOUS | Status: AC | PRN
  Administered 2020-11-28: 10 mL via INTRAVENOUS

## 2020-11-28 NOTE — Evaluation (Signed)
Occupational Therapy Evaluation Patient Details Name: Joshua Swanson MRN: [id number] DOB: [birth year] Today's Date: 11/28/2020   History of Present Illness 50 yo male s/p far lateral tumor biopsy and resection by Dr Zada Finders 11/27/20 PMH HTN   Clinical Impression   Patient is s/p tumor biopsy s/p L craniotomy on 11/27/20 surgery resulting in functional limitations due to the deficits listed below (see OT problem list). Pt currently requires (A) for balance with adls progressing well toward d/c home. Pt could benefit from increased mobility from staff throughout the day. Patient will benefit from skilled OT acutely to increase independence and safety with ADLS to allow discharge home.       Recommendations for follow up therapy are one component of a multi-disciplinary discharge planning process, led by the attending physician.  Recommendations may be updated based on patient status, additional functional criteria and insurance authorization.   Follow Up Recommendations  No OT follow up    Equipment Recommendations  3 in 1 bedside commode;Other (comment) (urinal)    Recommendations for Other Services       Precautions / Restrictions Precautions Precautions: Fall Precaution Comments: aline R hand      Mobility Bed Mobility Overal bed mobility: Needs Assistance Bed Mobility: Rolling;Supine to Sit Rolling: Min assist   Supine to sit: +2 for physical assistance;Min assist     General bed mobility comments: exiting the bed toward the Right side. pt with (A) to power up from bed surface with trunk. pt static sitting supervision    Transfers Overall transfer level: Needs assistance Equipment used: 1 person hand held assist Transfers: Sit to/from Stand Sit to Stand: +2 physical assistance;Min assist         General transfer comment: initially 2 person (A) but progressed to 1 person (A) after attmepting task.    Balance Overall balance assessment: Mild deficits observed, not  formally tested                                         ADL either performed or assessed with clinical judgement   ADL Overall ADL's : Needs assistance/impaired Eating/Feeding: Modified independent;Sitting   Grooming: Modified independent;Sitting Grooming Details (indicate cue type and reason): washing face Upper Body Bathing: Modified independent;Sitting   Lower Body Bathing: Moderate assistance;Sit to/from stand   Upper Body Dressing : Minimal assistance   Lower Body Dressing: Moderate assistance;Sit to/from stand   Toilet Transfer: Minimal Buyer, retail Details (indicate cue type and reason): steady assistance         Functional mobility during ADLs: Minimal assistance General ADL Comments: pt initially two person (A) with power up from bed but progressed to 1 person (A) during session. pt guarding with Aline in R hand at this time.     Vision Baseline Vision/History: 1 Wears glasses Ability to See in Adequate Light: 1 Impaired Vision Assessment?: No apparent visual deficits     Perception     Praxis      Pertinent Vitals/Pain Pain Assessment: 0-10 Pain Score: 4  Pain Location: neck Pain Descriptors / Indicators: Aching Pain Intervention(s): Monitored during session;Premedicated before session;Repositioned     Hand Dominance Right   Extremity/Trunk Assessment Upper Extremity Assessment Upper Extremity Assessment: Overall WFL for tasks assessed (denies any changes or numbness at htis time)   Lower Extremity Assessment Lower Extremity Assessment: Defer to PT evaluation   Cervical / Trunk Assessment  Cervical / Trunk Assessment: Other exceptions (s/p surgery)   Communication Communication Communication: No difficulties   Cognition Arousal/Alertness: Awake/alert Behavior During Therapy: WFL for tasks assessed/performed Overall Cognitive Status: Within Functional Limits for tasks assessed                                      General Comments  VSS    Exercises     Shoulder Instructions      Home Living Family/patient expects to be discharged to:: Private residence Living Arrangements: Spouse/significant other;Children   Type of Home: Mobile home Home Access: Stairs to enter Technical brewer of Steps: 3 Entrance Stairs-Rails: Left;Right Home Layout: One level     Bathroom Shower/Tub: Occupational psychologist: [place]: None   Additional Comments: kids 76 and 42, x2 dogs zeus and tremble      Prior Functioning/Environment Level of Independence: Independent        Comments: normally worked Scientist, research (life sciences) and recieving , playing video games        OT Problem List: Decreased activity tolerance;Impaired balance (sitting and/or standing);Decreased coordination;Decreased knowledge of use of DME or AE;Decreased knowledge of precautions;Pain      OT Treatment/Interventions: Self-care/ADL training;Neuromuscular education;Therapeutic exercise;Energy conservation;DME and/or AE instruction;Therapeutic activities;Patient/family education;Balance training    OT Goals(Current goals can be found in the care plan section) Acute Rehab OT Goals Patient Stated Goal: to return home OT Goal Formulation: With patient Time For Goal Achievement: 12/12/20 Potential to Achieve Goals: Good  OT Frequency: Min 2X/week   Barriers to D/C:            Co-evaluation PT/OT/SLP Co-Evaluation/Treatment: Yes Reason for Co-Treatment: For patient/therapist safety;To address functional/ADL transfers   OT goals addressed during session: ADL's and self-care;Proper use of Adaptive equipment and DME;Strengthening/ROM      AM-PAC OT "6 Clicks" Daily Activity     Outcome Measure Help from another person eating meals?: None Help from another person taking care of personal grooming?: None Help from another person toileting, which includes using toliet, bedpan, or  urinal?: A Little Help from another person bathing (including washing, rinsing, drying)?: A Little Help from another person to put on and taking off regular upper body clothing?: None Help from another person to put on and taking off regular lower body clothing?: A Little 6 Click Score: 21   End of Session Nurse Communication: Mobility status;Precautions  Activity Tolerance: Patient tolerated treatment well Patient left: in chair;with call bell/phone within reach;with chair alarm set  OT Visit Diagnosis: Unsteadiness on feet (R26.81);Muscle weakness (generalized) (M62.81);Pain                Time: 0165-5374 OT Time Calculation (min): 21 min Charges:  OT General Charges $OT Visit: 1 Visit OT Evaluation $OT Eval Moderate Complexity: 1 Mod   Brynn, OTR/L  Acute Rehabilitation Services Pager: [phone] Office: [phone] .   Jeri Modena 11/28/2020, 11:01 AM

## 2020-11-28 NOTE — Progress Notes (Signed)
Neurosurgery Service Progress Note  Subjective: No acute events overnight, expected neck pain, no other complaints   Objective: Vitals:   11/28/20 0500 11/28/20 0600 11/28/20 0700 11/28/20 0745  BP: (!) 146/82 139/89 (!) 121/91   Pulse: 88 85 83   Resp: (!) 29 (!) 25 (!) 29   Temp:    99 F (37.2 C)  TempSrc:    Oral  SpO2: 90% 92% 91%   Weight:      Height:        Physical Exam: AOx3, PERRL, EOMI, FS & SS, TM, Strength 5/5 x4, SILTx4 Incision c/d/i  Assessment & Plan: 50 y.o. man s/p far lateral for tumor biopsy / resection, recovering well.   -MRI reviewed, still pending final read but I see minimal, if any residual enhancement, potentially a GTR -path pending -transfer to stepdown -SCDs/TEDs, [facility] 9/29  Judith Part  11/28/20 9:09 AM

## 2020-11-28 NOTE — Evaluation (Signed)
Physical Therapy Evaluation Patient Details Name: Joshua Swanson MRN: [id number] DOB: 10-11-1970 Today's Date: 11/28/2020  History of Present Illness  49 yo male s/p far lateral tumor biopsy and resection by Dr Zada Finders 11/27/20 PMH HTN  Clinical Impression  PTA, patient lives with wife and 2 children and reports independence. Patient presents with weakness, impaired balance, and decreased activity tolerance. Patient required minA for balance with in room mobility with HHAx1-2. Patient will benefit from skilled PT services during acute stay to address listed deficits. Recommend OPPT following discharge to address balance and strength deficits.      Recommendations for follow up therapy are one component of a multi-disciplinary discharge planning process, led by the attending physician.  Recommendations may be updated based on patient status, additional functional criteria and insurance authorization.  Follow Up Recommendations Outpatient PT    Equipment Recommendations  Other (comment) (TBD)    Recommendations for Other Services       Precautions / Restrictions Precautions Precautions: Fall Precaution Comments: A-line R hand Restrictions Weight Bearing Restrictions: No      Mobility  Bed Mobility Overal bed mobility: Needs Assistance Bed Mobility: Rolling;Supine to Sit Rolling: Min assist   Supine to sit: +2 for physical assistance;Min assist     General bed mobility comments: exiting the bed toward the Right side. pt with (A) to power up from bed surface with trunk. pt static sitting supervision    Transfers Overall transfer level: Needs assistance Equipment used: 1 person hand held assist Transfers: Sit to/from Stand Sit to Stand: +2 physical assistance;Min assist         General transfer comment: initially 2 person (A) but progressed to 1 person (A) after attmepting task.  Ambulation/Gait Ambulation/Gait assistance: Min assist;+2 safety/equipment Gait Distance  (Feet): 20 Feet Assistive device: 1 person hand held assist;2 person hand held assist Gait Pattern/deviations: Step-to pattern;Decreased stride length;Wide base of support Gait velocity: decreased   General Gait Details: initially HHAx2 progressing to [place]. MinA for balance and +2 for safety. Patient with short step to pattern and high guard hand position. Patient denies dizziness/nausea with mobility.  Stairs            Wheelchair Mobility    Modified Rankin (Stroke Patients Only)       Balance Overall balance assessment: Mild deficits observed, not formally tested                                           Pertinent Vitals/Pain Pain Assessment: 0-10 Pain Score: 4  Pain Location: neck Pain Descriptors / Indicators: Aching Pain Intervention(s): Monitored during session    Home Living Family/patient expects to be discharged to:: Private residence Living Arrangements: Spouse/significant other;Children   Type of Home: Mobile home Home Access: Stairs to enter Entrance Stairs-Rails: Chemical engineer of Steps: 3 Home Layout: One level Home Equipment: None Additional Comments: kids 59 and 19, x2 dogs zeus and tremble    Prior Function Level of Independence: Independent         Comments: normally worked Scientist, research (life sciences) and receiving , playing video games     Hand Dominance   Dominant Hand: Right    Extremity/Trunk Assessment   Upper Extremity Assessment Upper Extremity Assessment: Defer to OT evaluation    Lower Extremity Assessment Lower Extremity Assessment: Overall WFL for tasks assessed (denies numbness/tingling)    Cervical / Trunk Assessment Cervical /  Trunk Assessment: Other exceptions (s/p surgery)  Communication   Communication: No difficulties  Cognition Arousal/Alertness: Awake/alert Behavior During Therapy: WFL for tasks assessed/performed Overall Cognitive Status: Within Functional Limits for tasks assessed                                         General Comments General comments (skin integrity, edema, etc.): VSS    Exercises     Assessment/Plan    PT Assessment Patient needs continued PT services  PT Problem List Decreased strength;Decreased activity tolerance;Decreased balance;Decreased mobility;Decreased knowledge of use of DME       PT Treatment Interventions DME instruction;Gait training;Stair training;Functional mobility training;Therapeutic activities;Therapeutic exercise;Balance training;Patient/family education    PT Goals (Current goals can be found in the Care Plan section)  Acute Rehab PT Goals Patient Stated Goal: to return home PT Goal Formulation: With patient Time For Goal Achievement: 12/12/20 Potential to Achieve Goals: Good    Frequency Min 4X/week   Barriers to discharge        Co-evaluation PT/OT/SLP Co-Evaluation/Treatment: Yes Reason for Co-Treatment: For patient/therapist safety;To address functional/ADL transfers PT goals addressed during session: Mobility/safety with mobility;Balance;Strengthening/ROM OT goals addressed during session: ADL's and self-care;Proper use of Adaptive equipment and DME;Strengthening/ROM       AM-PAC PT "6 Clicks" Mobility  Outcome Measure Help needed turning from your back to your side while in a flat bed without using bedrails?: A Little Help needed moving from lying on your back to sitting on the side of a flat bed without using bedrails?: A Little Help needed moving to and from a bed to a chair (including a wheelchair)?: A Little Help needed standing up from a chair using your arms (e.g., wheelchair or bedside chair)?: A Little Help needed to walk in hospital room?: A Little Help needed climbing 3-5 steps with a railing? : A Lot 6 Click Score: 17    End of Session   Activity Tolerance: Patient tolerated treatment well Patient left: in chair;with call bell/phone within reach;with chair alarm  set Nurse Communication: Mobility status PT Visit Diagnosis: Unsteadiness on feet (R26.81);Muscle weakness (generalized) (M62.81)    Time: 5638-9373 PT Time Calculation (min) (ACUTE ONLY): 21 min   Charges:   PT Evaluation $PT Eval Moderate Complexity: 1 Mod          Gloria Ricardo A. Gilford Rile PT, DPT Acute Rehabilitation Services Pager [phone] Office [phone]   [name] 11/28/2020, 11:08 AM

## 2020-11-29 ENCOUNTER — Other Ambulatory Visit: Payer: Self-pay | Admitting: Radiation Therapy

## 2020-11-29 NOTE — Progress Notes (Signed)
Neurosurgery Service Progress Note  Subjective: No acute events overnight, expected neck pain but improving, did have some Lhermitte-sounding symptoms which were pretty painful but improved  Objective: Vitals:   11/29/20 0600 11/29/20 0700 11/29/20 0755 11/29/20 0800  BP:  (!) 164/92 128/90 (!) 144/91  Pulse: (!) 103 (!) 105 89 84  Resp: (!) 28 (!) 32 19 (!) 27  Temp:    99.2 F (37.3 C)  TempSrc:      SpO2: 91% (!) 88% 93% 95%  Weight:      Height:        Physical Exam: AOx3, PERRL, EOMI, FS & SS, TM, Strength 5/5 x4, SILTx4 Incision c/d/i  Assessment & Plan: 50 y.o. man s/p far lateral for tumor biopsy / resection, recovering well. MRI w/ decreased enhancement of tumor versus resection  -path pending -transfer to regular floor bed -SCDs/TEDs, [facility] today -d/c home tomorrow likely  Judith Part  11/29/20 8:48 AM

## 2020-11-29 NOTE — Progress Notes (Signed)
Physical Therapy Treatment Patient Details Name: Joshua Swanson MRN: [id number] DOB: 03-12-1970 Today's Date: 11/29/2020   History of Present Illness 50 yo male s/p far lateral tumor biopsy and resection by Dr Zada Finders 11/27/20 PMH HTN    PT Comments    Patient progressing towards physical therapy goals. Patient required min guard-minA for ambulation with no AD. Patient demos mild unsteadiness and mild coordination deficits 2/2 to sensation deficits. Encouraged use of shoes for ambulation for safety and increased proprioceptive feedback. Performed reaching tasks and standing marching with no UE support. Continue to recommend OPPT following discharge to address balance and strength deficits.     Recommendations for follow up therapy are one component of a multi-disciplinary discharge planning process, led by the attending physician.  Recommendations may be updated based on patient status, additional functional criteria and insurance authorization.  Follow Up Recommendations  Outpatient PT     Equipment Recommendations  None recommended by PT    Recommendations for Other Services       Precautions / Restrictions Precautions Precautions: Fall Restrictions Weight Bearing Restrictions: No     Mobility  Bed Mobility               General bed mobility comments: up in chair on arrival    Transfers Overall transfer level: Needs assistance Equipment used: None Transfers: Sit to/from Stand Sit to Stand: Min guard         General transfer comment: min guard upon standing due to unsteadiness but patient able to self correct  Ambulation/Gait Ambulation/Gait assistance: Min guard;Min assist Gait Distance (Feet): 200 Feet Assistive device: None Gait Pattern/deviations: Step-through pattern;Decreased stride length;Ataxic;Wide base of support;Drifts right/left Gait velocity: decreased   General Gait Details: decreased sesnsation to bilateral feet resulting in ataxic/mild  steppage with ambulation. Drifting L/R throughout. Min guard for safety. MinA at times for balance. Encouraged patient to use shoes for next session.   Stairs Stairs: Yes Stairs assistance: Min guard Stair Management: One rail Right;Step to pattern;Forwards Number of Stairs: 2 General stair comments: min guard for safety, ascending leading with the L LE.   Wheelchair Mobility    Modified Rankin (Stroke Patients Only)       Balance Overall balance assessment: Mild deficits observed, not formally tested                                          Cognition Arousal/Alertness: Awake/alert Behavior During Therapy: WFL for tasks assessed/performed Overall Cognitive Status: Within Functional Limits for tasks assessed                                        Exercises Other Exercises Other Exercises: reaching outside BOS x 10 Other Exercises: standing marching with No UE support x 10    General Comments General comments (skin integrity, edema, etc.): on 2L O2 [place] on arrival, spO2 >96%. Removed O2 for mobility with patient able to maintain >95% throughout      Pertinent Vitals/Pain Pain Assessment: 0-10 Pain Score: 3  Pain Location: neck Pain Descriptors / Indicators: Aching Pain Intervention(s): Monitored during session    Home Living                      Prior Function  PT Goals (current goals can now be found in the care plan section) Acute Rehab PT Goals Patient Stated Goal: to return home PT Goal Formulation: With patient Time For Goal Achievement: 12/12/20 Potential to Achieve Goals: Good Progress towards PT goals: Progressing toward goals    Frequency    Min 4X/week      PT Plan Current plan remains appropriate    Co-evaluation              AM-PAC PT "6 Clicks" Mobility   Outcome Measure  Help needed turning from your back to your side while in a flat bed without using bedrails?: A  Little Help needed moving from lying on your back to sitting on the side of a flat bed without using bedrails?: A Little Help needed moving to and from a bed to a chair (including a wheelchair)?: A Little Help needed standing up from a chair using your arms (e.g., wheelchair or bedside chair)?: A Little Help needed to walk in hospital room?: A Little Help needed climbing 3-5 steps with a railing? : A Little 6 Click Score: 18    End of Session Equipment Utilized During Treatment: Gait belt Activity Tolerance: Patient tolerated treatment well Patient left: in chair;with call bell/phone within reach Nurse Communication: Mobility status PT Visit Diagnosis: Unsteadiness on feet (R26.81);Muscle weakness (generalized) (M62.81)     Time: 5456-2563 PT Time Calculation (min) (ACUTE ONLY): 17 min  Charges:  $Gait Training: 8-22 mins                     Joyleen Haselton A. Gilford Rile PT, DPT Acute Rehabilitation Services Pager [phone] Office [phone]    [name] 11/29/2020, 9:17 AM

## 2020-11-30 MED ORDER — BACLOFEN 10 MG PO TABS
10.0000 mg | ORAL_TABLET | Freq: Three times a day (TID) | ORAL | Status: DC
  Administered 2020-11-30 – 2020-12-01 (×3): 10 mg via ORAL
  Filled 2020-11-30 (×3): qty 1

## 2020-11-30 NOTE — Progress Notes (Addendum)
Patient complaining of new onset R sided "muscle spasms" that started last night but have become more frequent. I watched an episode just now and saw the patients RUE, RLE, and abdominal muscles seize up for about 15-20 seconds then stop. No change in LOC during episodes. Neuro exam intact. Dr. Zada Finders notified of this at this time.   1557 update: no new orders at this time per MD.  Montez Hageman RN

## 2020-11-30 NOTE — Progress Notes (Signed)
Occupational Therapy Treatment Patient Details Name: Joshua Swanson MRN: [id number] DOB: Sep 30, 1970 Today's Date: 11/30/2020   History of present illness 50 yo male s/p far lateral tumor biopsy and resection by Dr Zada Finders 11/27/20 PMH HTN   OT comments  RN made aware of R side muscle spasm events and witnessed last event. Recommend d/c home if spasms resolve. Pt currently in chair sitting up drinking from cup.    Recommendations for follow up therapy are one component of a multi-disciplinary discharge planning process, led by the attending physician.  Recommendations may be updated based on patient status, additional functional criteria and insurance authorization.    Follow Up Recommendations  No OT follow up    Equipment Recommendations  3 in 1 bedside commode;Other (comment)    Recommendations for Other Services      Precautions / Restrictions Precautions Precautions: Fall Restrictions Weight Bearing Restrictions: No       Mobility Bed Mobility               General bed mobility comments: oob in chair    Transfers Overall transfer level: Needs assistance Equipment used: None Transfers: Sit to/from Stand Sit to Stand: Supervision         General transfer comment: did not complete as pt with muscle spasm event very close in time together, RN notified. Recommend 2 person (A) back to bed due to safety    Balance Overall balance assessment: Mild deficits observed, not formally tested                                         ADL either performed or assessed with clinical judgement   ADL Overall ADL's : Needs assistance/impaired Eating/Feeding: Modified independent;Sitting   Grooming: Modified independent;Sitting                                 General ADL Comments: pt states "R side is locking" pt with R UE hand drawning into flexion, elbow flexion, abdominal contraction, L knee knee flexion all downt he R side of the body for  15-20 seconds. pt reports starting yesterday adn the first one being the scaried calling out to the RN on the call bell for help> pt reports the time between them has varied. pt with 2 now in 20 minutes. pt reports horizontal head movmeents being the positioning prior to event.     Vision       Perception     Praxis      Cognition Arousal/Alertness: Awake/alert Behavior During Therapy: WFL for tasks assessed/performed Overall Cognitive Status: Within Functional Limits for tasks assessed                                          Exercises Other Exercises Other Exercises: provided paper to write down when event occurs so we can track time of events and how often. pt describes 12-15 times in last 24 hours   Shoulder Instructions       General Comments      Pertinent Vitals/ Pain       Pain Assessment: No/denies pain Faces Pain Scale: No hurt Pain Intervention(s): Monitored during session  Home Living  Prior Functioning/Environment              Frequency  Min 2X/week        Progress Toward Goals  OT Goals(current goals can now be found in the care plan section)  Progress towards OT goals: Progressing toward goals  Acute Rehab OT Goals Patient Stated Goal: to return home OT Goal Formulation: With patient Time For Goal Achievement: 12/12/20 Potential to Achieve Goals: Good ADL Goals Pt Will Transfer to Toilet: with modified independence;ambulating;regular height toilet Pt Will Perform Tub/Shower Transfer: Shower transfer;with modified independence;ambulating Additional ADL Goal #1: pt will complete bed mobility mod i as precursor to adls.  Plan Discharge plan remains appropriate    Co-evaluation                 AM-PAC OT "6 Clicks" Daily Activity     Outcome Measure   Help from another person eating meals?: None Help from another person taking care of personal grooming?:  None Help from another person toileting, which includes using toliet, bedpan, or urinal?: A Little Help from another person bathing (including washing, rinsing, drying)?: A Little Help from another person to put on and taking off regular upper body clothing?: None Help from another person to put on and taking off regular lower body clothing?: A Little 6 Click Score: 21    End of Session    OT Visit Diagnosis: Unsteadiness on feet (R26.81);Muscle weakness (generalized) (M62.81);Pain   Activity Tolerance Patient tolerated treatment well   Patient Left in chair;with call bell/phone within reach;with chair alarm set   Nurse Communication Mobility status;Precautions        Time: 6659-9357 OT Time Calculation (min): 13 min  Charges: OT General Charges $OT Visit: 1 Visit OT Treatments $Self Care/Home Management : 8-22 mins   Brynn, OTR/L  Acute Rehabilitation Services Pager: [phone] Office: [phone] .   Jeri Modena 11/30/2020, 4:03 PM

## 2020-11-30 NOTE — Progress Notes (Signed)
Physical Therapy Treatment Patient Details Name: Joshua Swanson MRN: [id number] DOB: 1971/01/04 Today's Date: 11/30/2020   History of Present Illness 50 yo male s/p far lateral tumor biopsy and resection by Dr Zada Finders 11/27/20 PMH HTN    PT Comments    Patient reporting "right arm seizing/cramping up with sudden head turns." Patient ambulated 300' with minA and no AD, decreased L foot clearance with numerous minor LOBs. Cues throughout to increase foot clearance. Negotiated 3 stairs with min guard and R hand rail. Continue to recommend OPPT to address balance and strength deficits.     Recommendations for follow up therapy are one component of a multi-disciplinary discharge planning process, led by the attending physician.  Recommendations may be updated based on patient status, additional functional criteria and insurance authorization.  Follow Up Recommendations  Outpatient PT     Equipment Recommendations  None recommended by PT    Recommendations for Other Services       Precautions / Restrictions Precautions Precautions: Fall Restrictions Weight Bearing Restrictions: No     Mobility  Bed Mobility               General bed mobility comments: up in chair on arrival    Transfers Overall transfer level: Needs assistance Equipment used: None Transfers: Sit to/from Stand Sit to Stand: Supervision         General transfer comment: supervision for safety  Ambulation/Gait Ambulation/Gait assistance: Min assist Gait Distance (Feet): 300 Feet Assistive device: None Gait Pattern/deviations: Step-through pattern;Decreased stride length;Ataxic;Wide base of support;Drifts right/left Gait velocity: decreased   General Gait Details: tripping over L foot due to decreased foot clearance on the L. MinA for balance due to numerous minor LOBs. Cues for slowed pace and increasing foot clearance.   Stairs Stairs: Yes Stairs assistance: Min guard Stair Management: One rail  Right;Step to pattern;Forwards Number of Stairs: 3 General stair comments: min guard for safety, ascending leading with the L LE.   Wheelchair Mobility    Modified Rankin (Stroke Patients Only)       Balance Overall balance assessment: Mild deficits observed, not formally tested                                          Cognition Arousal/Alertness: Awake/alert Behavior During Therapy: WFL for tasks assessed/performed Overall Cognitive Status: Within Functional Limits for tasks assessed                                        Exercises      General Comments        Pertinent Vitals/Pain Pain Assessment: Faces Faces Pain Scale: No hurt Pain Intervention(s): Monitored during session    Home Living                      Prior Function            PT Goals (current goals can now be found in the care plan section) Acute Rehab PT Goals Patient Stated Goal: to return home PT Goal Formulation: With patient Time For Goal Achievement: 12/12/20 Potential to Achieve Goals: Good Progress towards PT goals: Progressing toward goals    Frequency    Min 4X/week      PT Plan Current plan remains appropriate  Co-evaluation              AM-PAC PT "6 Clicks" Mobility   Outcome Measure  Help needed turning from your back to your side while in a flat bed without using bedrails?: A Little Help needed moving from lying on your back to sitting on the side of a flat bed without using bedrails?: A Little Help needed moving to and from a bed to a chair (including a wheelchair)?: A Little Help needed standing up from a chair using your arms (e.g., wheelchair or bedside chair)?: A Little Help needed to walk in hospital room?: A Little Help needed climbing 3-5 steps with a railing? : A Little 6 Click Score: 18    End of Session Equipment Utilized During Treatment: Gait belt Activity Tolerance: Patient tolerated treatment  well Patient left: in chair;with call bell/phone within reach Nurse Communication: Mobility status PT Visit Diagnosis: Unsteadiness on feet (R26.81);Muscle weakness (generalized) (M62.81)     Time: 3317-4099 PT Time Calculation (min) (ACUTE ONLY): 18 min  Charges:  $Gait Training: 8-22 mins                     Joshua Swanson A. Joshua Swanson PT, DPT Acute Rehabilitation Services Pager [phone] Office [phone]    Joshua Swanson 11/30/2020, 3:29 PM

## 2020-12-01 MED ORDER — OXYCODONE HCL 5 MG PO TABS: 5.0000 mg | ORAL_TABLET | ORAL | 0 refills | Status: DC | PRN

## 2020-12-01 MED ORDER — BACLOFEN 10 MG PO TABS: 10.0000 mg | ORAL_TABLET | Freq: Three times a day (TID) | ORAL | 0 refills | Status: DC

## 2020-12-01 MED ORDER — DOCUSATE SODIUM 100 MG PO CAPS: 100.0000 mg | ORAL_CAPSULE | Freq: Two times a day (BID) | ORAL | 0 refills | Status: AC

## 2020-12-01 NOTE — Progress Notes (Signed)
Joshua Swanson to be D/C'd home per MD order. Discussed with the patient and wife and all questions fully answered.  Skin clean and dry without evidence of skin break down, no evidence of skin tears noted. Incision to head clean and intact.  IV catheter discontinued intact. Site without signs and symptoms of complications. Dressing and pressure applied.  An After Visit Summary was printed and given to the patient. Patient's wife stated they will use a family member's shower chair instead of getting one from hospital. Patient escorted via [facility], and D/C home via private auto.  Melonie Florida  12/01/2020 2:56 PM

## 2020-12-01 NOTE — Discharge Summary (Signed)
Physician Discharge Summary     Providing Compassionate, Quality Care - Together   Patient ID: Joshua Swanson MRN: [id number] DOB/AGE: 1971/01/03 50 y.o.  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 Zada Finders on 11/27/2020. He was admitted to [facility] following recovery from anesthesia in the PACU. His postoperative course has been uncomplicated. He has has some intermittent spasming of his right upper and lower extremities that responds to baclofen. He has worked with both physical and occupational therapies who feel the patient is ready for discharge home. He is ambulating independently and without difficulty. He is tolerating a normal diet. He is not having any bowel or bladder dysfunction. His pain is well-controlled with oral pain medication. He is ready for discharge home.   Consults: None  Significant Diagnostic Studies: radiology: MR BRAIN W WO CONTRAST  Result Date: 11/28/2020 CLINICAL DATA:  Brain mass.  Open biopsy. EXAM: MRI HEAD WITHOUT AND WITH CONTRAST TECHNIQUE: Multiplanar, multiecho pulse sequences of the brain and surrounding structures were obtained without and with intravenous contrast. CONTRAST:  52mL GADAVIST GADOBUTROL 1 MMOL/ML IV SOLN COMPARISON:  10/18/2020 FINDINGS: Brain: Probable trace hemorrhage at the level of the left medullary lesion. No complicating infarct. There has been decreased mass effect and minimal if any enhancement, atypical for a neoplasm. An inflammatory process (such as sarcoid), or infectious process are considered given this unusual evolution. Small subdural collection behind the clivus. No unexpected blood products. Pneumocephalus along sulci and around the frontal lobes. No hydrocephalus or brain sagging. Dilated  perivascular spaces seen in the cerebral white matter on the right. Vascular: Preserved flow voids and vascular enhancements, including in the left vertebral. Skull and upper cervical spine: Left suboccipital craniotomy with fairly extensive adjacent fluid posterior to the bone flap. Sinuses/Orbits: Negative IMPRESSION: 1. Brainstem biopsy without complicating features. 2. Unexpected decrease (for neoplasm) in enhancement and swelling since preoperative MRI, has there been any pre-surgical intervention such as steroids? Question primary inflammatory process, even sarcoid. Electronically Signed   By: Jorje Guild M.D.   On: 11/28/2020 10:57     Treatments: surgery:  Left far lateral craniotomy for resection of intramedullary brainstem tumor, left C1 hemilaminectomy, use of frameless stereotaxy, operating microscope, and intra-operative neuromonitoring    Discharge Exam: Blood pressure (!) 158/87, pulse 96, temperature 98.3 F (36.8 C), temperature source Oral, resp. rate 17, height 5\' 11"  (1.803 m), weight 99.8 kg, SpO2 96 %.  Alert and oriented x 4 PERRLA CN II-XII grossly intact MAE, Strength and sensation intact Incision is clean, dry, and intact  Disposition: Discharge disposition: 01-Home or Self Care        Allergies as of 12/01/2020   No Known Allergies      Medication List     TAKE these medications    baclofen 10 MG tablet Commonly known as: LIORESAL Take 1 tablet (10 mg total) by mouth 3 (three) times daily.   docusate sodium 100 MG capsule Commonly known as: COLACE Take 1 capsule (100 mg total) by mouth 2 (two) times daily.   losartan 50 MG tablet Commonly known as: COZAAR Take 50 mg by mouth daily.   oxyCODONE 5 MG immediate release tablet Commonly known as: Oxy IR/ROXICODONE Take 1-2 tablets (5-10 mg total) by mouth every 4 (four) hours as needed for moderate pain  or severe pain.   simvastatin 20 MG tablet Commonly known as: ZOCOR Take 20 mg by mouth  at bedtime.   vitamin C 500 MG tablet Commonly known as: ASCORBIC ACID Take 500 mg by mouth daily.   Vitamin D 125 MCG (5000 UT) Caps Take 5,000 Units by mouth daily.   ZINC PO Take 27 mg by mouth daily.        Follow-up Information     Judith Part, MD. Go on 12/12/2020.   Specialty: Neurosurgery Why: First post op appointment is scheduled for 12/12/2020 at 9:00 am. Contact information: [place] [id number] [phone]                 Signed: Viona Gilmore, DNP, AGNP-C Nurse Practitioner  [facility] [place]. [address], [place], [place] [address] P: [phone]    F: [phone]  12/01/2020, 10:29 AM

## 2020-12-01 NOTE — Discharge Instructions (Signed)
Wound Care You are fine to take a shower. Pat incision dry. Do not put any creams, lotions, or ointments on incision.  Activity Walk each and every day, increasing distance each day. No lifting greater than 5 lbs.  Avoid excessive neck motion. No driving for 2 weeks; may ride as a passenger locally.  Diet Resume your normal diet.   Return to Work Will be discussed at your follow up appointment.  Call Your Doctor If Any of These Occur Redness, drainage, or swelling at the wound.  Temperature greater than 101 degrees. Severe pain not relieved by pain medication. Incision starts to come apart.  Follow Up Appt Call today for appointment in 1-2 weeks [phone]) or for problems.

## 2020-12-03 ENCOUNTER — Encounter (HOSPITAL_COMMUNITY): Payer: Self-pay | Admitting: Neurological Surgery

## 2020-12-03 ENCOUNTER — Inpatient Hospital Stay: Payer: 59

## 2020-12-10 ENCOUNTER — Inpatient Hospital Stay: Payer: 59

## 2020-12-11 ENCOUNTER — Encounter (HOSPITAL_COMMUNITY): Payer: Self-pay

## 2020-12-11 LAB — SURGICAL PATHOLOGY

## 2020-12-20 ENCOUNTER — Telehealth: Payer: Self-pay | Admitting: *Deleted

## 2020-12-20 NOTE — Telephone Encounter (Signed)
Patients wife returned call to schedule.  Outbound call was from Manuela Schwartz (wife states).  Emailed Manuela Schwartz and Dr Mickeal Skinner to find out what type of visit the patient would need.  Pending response

## 2020-12-27 ENCOUNTER — Other Ambulatory Visit: Payer: Self-pay

## 2020-12-27 ENCOUNTER — Telehealth: Payer: Self-pay | Admitting: Internal Medicine

## 2020-12-27 ENCOUNTER — Inpatient Hospital Stay: Payer: 59 | Attending: Internal Medicine | Admitting: Internal Medicine

## 2020-12-27 VITALS — BP 119/82 | HR 104 | Temp 97.5°F | Resp 20 | Wt 197.2 lb

## 2020-12-27 DIAGNOSIS — G9389 Other specified disorders of brain: Secondary | ICD-10-CM | POA: Insufficient documentation

## 2020-12-27 DIAGNOSIS — M25619 Stiffness of unspecified shoulder, not elsewhere classified: Secondary | ICD-10-CM | POA: Insufficient documentation

## 2020-12-27 MED ORDER — GABAPENTIN 100 MG PO CAPS: 100.0000 mg | ORAL_CAPSULE | Freq: Three times a day (TID) | ORAL | 1 refills | Status: DC | PRN

## 2020-12-27 NOTE — Progress Notes (Signed)
[facility] at [place], [place] [address] [phone]   New Patient Evaluation  Date of Service: 12/27/20 Patient Name: Joshua Swanson Patient MRN: [id number] Patient DOB: [birth year] Provider: Ventura Sellers, MD  Identifying Statement:  Joshua Swanson is a 50 y.o. male with  brainstem   inflammatory mass  who presents for initial consultation and evaluation.    Referring Provider: Servando [place], [place] [address]  Oncologic History: 11/28/20: Craniotomy, biopsy with Dr. Zada Finders; path shows inflammatory tissue only  History of Present Illness: The patient's records from the referring physician were obtained and reviewed and the patient interviewed to confirm this HPI.  Joshua Swanson had experienced 2-3 weeks history of right sided numbness affecting his face, arm, and leg.  There was no issues with gait, speech, no headaches or seizures.  CNS imaging done at [facility] demonstrated a brainstem mass, and prompted biopsy by Dr. Zada Finders on 11/28/20.  Path demonstrated inflammatory cells only, and post-op MRI showed improvement in mass effect and enhancing burden.  Per patient and available records, he had normal metastatic workup (CTs) and never received any steroids.  Medications: Current Outpatient Medications on File Prior to Visit  Medication Sig Dispense Refill   Cholecalciferol (VITAMIN D) 125 MCG (5000 UT) CAPS Take 5,000 Units by mouth daily.     docusate sodium (COLACE) 100 MG capsule Take 1 capsule (100 mg total) by mouth 2 (two) times daily. 10 capsule 0   losartan (COZAAR) 50 MG tablet Take 50 mg by mouth daily.     Multiple Vitamins-Minerals (ZINC PO) Take 27 mg by mouth daily.     simvastatin (ZOCOR) 20 MG tablet Take 20 mg by mouth at bedtime.     Current Facility-Administered Medications on File Prior to Visit  Medication Dose Route Frequency Provider Last Rate Last Admin   0.9 %  sodium chloride  infusion   Intravenous PRN Townsend Roger, MD        Allergies: No Known Allergies Past Medical History:  Past Medical History:  Diagnosis Date   Hypertension    Past Surgical History:  Past Surgical History:  Procedure Laterality Date   APPLICATION OF CRANIAL NAVIGATION N/A 11/27/2020   Procedure: APPLICATION OF CRANIAL NAVIGATION;  Surgeon: Judith Part, MD;  Location: [facility];  Service: Neurosurgery;  Laterality: N/A;   CRANIOTOMY Left 11/27/2020   Procedure: Left Far Lateral Craniotomy for Tumor Resection;  Surgeon: Judith Part, MD;  Location: [facility];  Service: Neurosurgery;  Laterality: Left;   FRACTURE SURGERY     Social History:  Social History   Socioeconomic History   Marital status: Married    Spouse name: Not on file   Number of children: Not on file   Years of education: Not on file   Highest education level: Not on file  Occupational History   Not on file  Tobacco Use   Smoking status: Never   Smokeless tobacco: Never  Vaping Use   Vaping Use: Never used  Substance and Sexual Activity   Alcohol use: Not on file    Comment: occasionally   Drug use: Not on file   Sexual activity: Not on file  Other Topics Concern   Not on file  Social History Narrative   Not on file   Social Determinants of Health   Financial Resource Strain: Not on file  Food Insecurity: Not on file  Transportation Needs: Not  on file  Physical Activity: Not on file  Stress: Not on file  Social Connections: Not on file  Intimate Partner Violence: Not on file   Family History: No family history on file.  Review of Systems: Constitutional: Doesn't report fevers, chills or abnormal weight loss Eyes: Doesn't report blurriness of vision Ears, nose, mouth, throat, and face: Doesn't report sore throat Respiratory: Doesn't report cough, dyspnea or wheezes Cardiovascular: Doesn't report palpitation, chest discomfort  Gastrointestinal:  Doesn't report nausea, constipation,  diarrhea GU: Doesn't report incontinence Skin: Doesn't report skin rashes Neurological: Per HPI Musculoskeletal: Doesn't report joint pain Behavioral/Psych: Doesn't report anxiety  Physical Exam: Vitals:   12/27/20 1000  BP: 119/82  Pulse: (!) 104  Resp: 20  Temp: (!) 97.5 F (36.4 C)  SpO2: 99%   KPS: 90. General: Alert, cooperative, pleasant, in no acute distress Head: Normal EENT: No conjunctival injection or scleral icterus.  Lungs: Resp effort normal Cardiac: Regular rate Abdomen: Non-distended abdomen Skin: No rashes cyanosis or petechiae. Extremities: Limited ROM R shoulder  Neurologic Exam: Mental Status: Awake, alert, attentive to examiner. Oriented to self and environment. Language is fluent with intact comprehension.  Cranial Nerves: Visual acuity is grossly normal. Visual fields are full. Extra-ocular movements intact. No ptosis. Face is symmetric Motor: Tone and bulk are normal. Power is full in both arms and legs. Reflexes are symmetric, no pathologic reflexes present.  Sensory: Intact to light touch Gait: Normal.   Labs: I have reviewed the data as listed    Component Value Date/Time   NA 137 11/22/2020 1000   K 3.8 11/22/2020 1000   CL 106 11/22/2020 1000   CO2 22 11/22/2020 1000   GLUCOSE 109 (H) 11/22/2020 1000   BUN 10 11/22/2020 1000   CREATININE 0.96 11/27/2020 1545   CALCIUM 9.3 11/22/2020 1000   GFRNONAA >60 11/27/2020 1545   Lab Results  Component Value Date   WBC 9.7 11/27/2020   HGB 13.5 11/27/2020   HCT 37.6 (L) 11/27/2020   MCV 88.3 11/27/2020   PLT 215 11/27/2020    Imaging: [facility] Clinician Interpretation: I have personally reviewed the CNS images as listed.  My interpretation, in the context of the patient's clinical presentation, is stable disease  MR BRAIN W WO CONTRAST  Result Date: 11/28/2020 CLINICAL DATA:  Brain mass.  Open biopsy. EXAM: MRI HEAD WITHOUT AND WITH CONTRAST TECHNIQUE: Multiplanar, multiecho pulse  sequences of the brain and surrounding structures were obtained without and with intravenous contrast. CONTRAST:  32m GADAVIST GADOBUTROL 1 MMOL/ML IV SOLN COMPARISON:  10/18/2020 FINDINGS: Brain: Probable trace hemorrhage at the level of the left medullary lesion. No complicating infarct. There has been decreased mass effect and minimal if any enhancement, atypical for a neoplasm. An inflammatory process (such as sarcoid), or infectious process are considered given this unusual evolution. Small subdural collection behind the clivus. No unexpected blood products. Pneumocephalus along sulci and around the frontal lobes. No hydrocephalus or brain sagging. Dilated perivascular spaces seen in the cerebral white matter on the right. Vascular: Preserved flow voids and vascular enhancements, including in the left vertebral. Skull and upper cervical spine: Left suboccipital craniotomy with fairly extensive adjacent fluid posterior to the bone flap. Sinuses/Orbits: Negative IMPRESSION: 1. Brainstem biopsy without complicating features. 2. Unexpected decrease (for neoplasm) in enhancement and swelling since preoperative MRI, has there been any pre-surgical intervention such as steroids? Question primary inflammatory process, even sarcoid. Electronically Signed   By: JJorje GuildM.D.   On: 11/28/2020 10:57  Pathology: SURGICAL PATHOLOGY  CASE: [id number]  PATIENT: Joshua Swanson  Surgical Pathology Report   Clinical History: brain tumor (cm)   FINAL MICROSCOPIC DIAGNOSIS:   A. BRAIN TUMOR, BRAINSTEM, RESECTION:  - White matter with perivascular and focal parenchymal chronic  inflammation, see comment   COMMENT:   This case was sent in consultation to Dr. Moody Bruins at [place], [place], [place].  A copy of the outside pathology  report is available in patient's electronic medical records.    Assessment/Plan Brain mass - Plan: Ambulatory referral to Physical Therapy  Limited  range of motion (ROM) of shoulder - Plan: Ambulatory referral to Physical Therapy  Joshua Swanson is clinically stable today, now ~1 month following biopsy of brainstem mass lesion.  We are encouraged by his good clinical outcome following surgery, and pathology indicates inflammatory rather than neoplastic process.  We reviewed pathology and its implications.  He does not appear to fall neatly into any neuro-immunologic syndrome.  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   We recommended serologic evaluation for the following: ESR, CRP, TSH, Vit D, Vit B12, Immunoglobulins, complement, Anti-DNA, ANCA, ANA w/ reflex, antiphospholipid antibodies.  He has elected to defer CSF analysis for now.  We did recommend repeating a contrast enhanced brain MRI in 3 months.  We suspect right shoulder and leg symptoms are neuropathic in nature from surgical positioning.  Recommended trial of gabapentin 149m q8 PRN, and referral to PT locally.  We appreciate the opportunity to participate in the care of Joshua Swanson  He is agreeable with this plan.  We spent twenty additional minutes teaching regarding the natural history, biology, and historical experience in the treatment of brain tumors. We then discussed in detail the current recommendations for therapy focusing on the mode of administration, mechanism of action, anticipated toxicities, and quality of life issues associated with this plan. We also provided teaching sheets for the patient to take home as an additional resource.  All questions were answered. The patient knows to call the clinic with any problems, questions or concerns. No barriers to learning were detected.  The total time spent in the encounter was 60 minutes and more than 50% was on counseling and review of test results   ZVentura Sellers MD Medical Director of Neuro-[facility]10/27/22 2:42 PM

## 2020-12-27 NOTE — Telephone Encounter (Signed)
Scheduled per 10/27 los, pt has been called and confirmed appt

## 2020-12-31 ENCOUNTER — Other Ambulatory Visit: Payer: Self-pay | Admitting: *Deleted

## 2020-12-31 DIAGNOSIS — G9389 Other specified disorders of brain: Secondary | ICD-10-CM

## 2021-01-01 ENCOUNTER — Other Ambulatory Visit: Payer: Self-pay | Admitting: Internal Medicine

## 2021-01-01 MED ORDER — PROCHLORPERAZINE MALEATE 10 MG PO TABS: 10.0000 mg | ORAL_TABLET | Freq: Four times a day (QID) | ORAL | 1 refills | Status: AC | PRN

## 2021-01-15 ENCOUNTER — Telehealth: Payer: Self-pay | Admitting: *Deleted

## 2021-01-15 NOTE — Telephone Encounter (Signed)
Wife called to let us know that yesterday patient developed a swollen lip suddenly on one side.  She states she gave him a benadryl concerned that he was reacting to something however, she doesn't know of what.  She states a little bit later the opposite side became swollen.  She finds this random symptom concerning since you previously had concern for neurosarcoidosis or neuro immunologic syndrome.    Routing to MD to please give insight into issue and if it is of any concern from providers perspective.

## 2021-01-28 ENCOUNTER — Other Ambulatory Visit: Payer: Self-pay

## 2021-01-28 ENCOUNTER — Inpatient Hospital Stay: Payer: Medicaid Other | Attending: Internal Medicine

## 2021-01-28 DIAGNOSIS — G9389 Other specified disorders of brain: Secondary | ICD-10-CM | POA: Diagnosis not present

## 2021-01-28 LAB — SEDIMENTATION RATE: Sed Rate: 28 mm/hr — ABNORMAL HIGH (ref 0–16)

## 2021-01-28 LAB — C-REACTIVE PROTEIN: CRP: 0.6 mg/dL (ref <1.0)

## 2021-01-28 LAB — VITAMIN D 25 HYDROXY (VIT D DEFICIENCY, FRACTURES): Vit D, 25-Hydroxy: 76.07 ng/mL (ref 30–100)

## 2021-01-28 LAB — VITAMIN B12: Vitamin B-12: 264 pg/mL (ref 180–914)

## 2021-01-28 LAB — TSH: TSH: 2.691 u[IU]/mL (ref 0.320–4.118)

## 2021-01-29 LAB — ANCA TITERS
Atypical P-ANCA titer: <1:20 {titer}
C-ANCA: <1:20 {titer}
P-ANCA: <1:20 {titer}

## 2021-01-29 LAB — IGG, IGA, IGM
IgA: 147 mg/dL (ref 90–386)
IgG (Immunoglobin G), Serum: 2152 mg/dL — ABNORMAL HIGH (ref 603–1613)
IgM (Immunoglobulin M), Srm: 57 mg/dL (ref 20–172)

## 2021-01-29 LAB — ANTI-DNA ANTIBODY, DOUBLE-STRANDED: ds DNA Ab: <1 IU/mL (ref 0–9)

## 2021-01-30 LAB — ANTINUCLEAR ANTIBODIES, IFA: ANA Ab, IFA: NEGATIVE

## 2021-01-31 ENCOUNTER — Telehealth: Payer: Self-pay | Admitting: *Deleted

## 2021-01-31 NOTE — Telephone Encounter (Signed)
Patients wife reports that right leg swollen in calf area.  Denies any pain.  The swelling does resolve with rest and elevation.  Some warmth to the area.  They have an appt scheduled with PCP to address on 12/16.  If MD feels that this needs to be addressed prior they would be willing to get it evaluated sooner.    Routing to MD

## 2021-01-31 NOTE — Telephone Encounter (Signed)
Faxed updated referral to [facility]

## 2021-01-31 NOTE — Telephone Encounter (Signed)
Patients wife called to report that previous PT request was never followed through as they were uninsured and that company wouldn't see the patient.    They have since then gotten Medicaid and want to do physical therapy but local to home in [place].   Referral faxed to [facility] in [facility].

## 2021-03-15 ENCOUNTER — Other Ambulatory Visit: Payer: Self-pay | Admitting: Internal Medicine

## 2021-03-26 ENCOUNTER — Other Ambulatory Visit: Payer: Self-pay | Admitting: Radiation Therapy

## 2021-03-28 ENCOUNTER — Other Ambulatory Visit: Payer: Self-pay

## 2021-03-28 ENCOUNTER — Ambulatory Visit (HOSPITAL_COMMUNITY)
Admission: RE | Admit: 2021-03-28 | Discharge: 2021-03-28 | Disposition: A | Discharge status: Home / Self Care | Payer: Medicaid Other | Source: Ambulatory Visit | Attending: Internal Medicine | Admitting: Internal Medicine

## 2021-03-28 DIAGNOSIS — G9389 Other specified disorders of brain: Secondary | ICD-10-CM | POA: Diagnosis present

## 2021-03-28 IMAGING — MR MR HEAD WO/W CM
13 series · 48 of 48 positions shown · IV contrast (gadavist)
Comparison: Head MRI [DATE]

CLINICAL DATA: Brain/CNS neoplasm, assess treatment response.
Biopsy in [DATE] showing inflammatory tissue.

EXAM:
MRI HEAD WITHOUT AND WITH CONTRAST
TECHNIQUE: Multiplanar, multiecho pulse sequences of the brain and surrounding
structures were obtained without and with intravenous contrast.
CONTRAST:  10mL GADAVIST GADOBUTROL 1 MMOL/ML IV SOLN

[Series 5: DWI · axial · 3.0mm · 1.36mm/px · z∈[-10,+131]mm · 5 of 100 slices shown (1 of 2)]
[im 1/100]
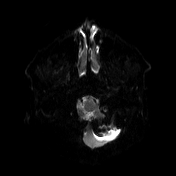
[im 25/100]
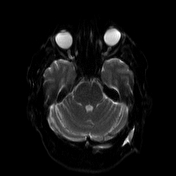
[im 50/100]
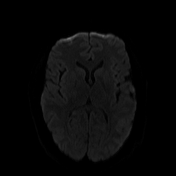
[im 75/100]
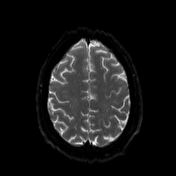
[im 100/100]
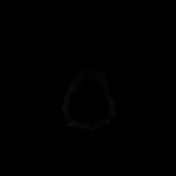

[Series 6: DWI · axial · 3.0mm · 1.36mm/px · z∈[-10,+131]mm · 3 of 49 slices shown (2 of 2)]
[im 1/49]
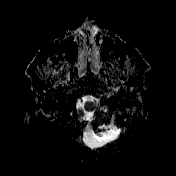
[im 25/49]
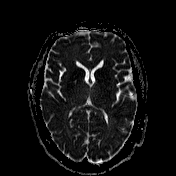
[im 49/49]
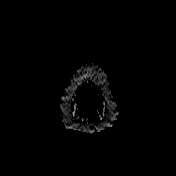

[Series 7: T1 · sagittal · 5.0mm · 0.81mm/px · 2 of 26 slices shown (1 of 2)]
[im 1/26]
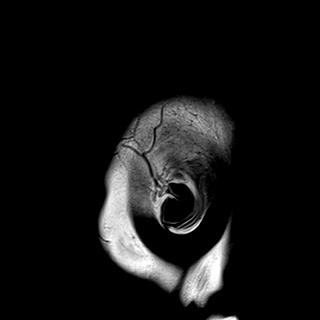
[im 26/26]
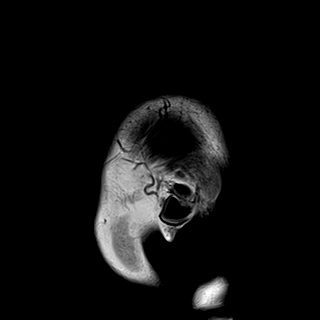

[Series 8: T2 · axial · 5.0mm · 0.62mm/px · z∈[-27,+131]mm · 2 of 26 slices shown]
[im 1/26]
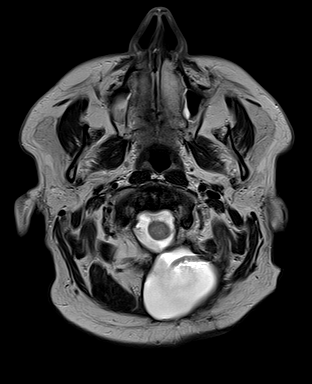
[im 26/26]
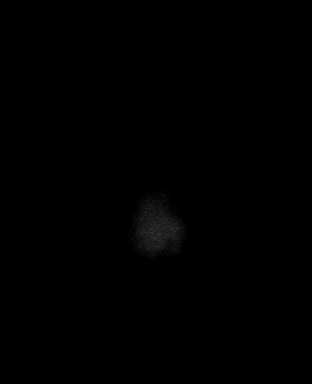

[Series 9: swi_images · axial · 3.0mm · 0.75mm/px · z∈[-30,+131]mm · 3 of 56 slices shown]
[im 1/56]
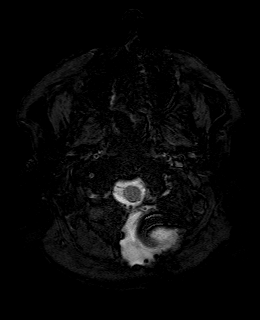
[im 28/56]
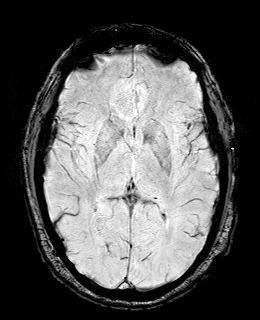
[im 56/56]
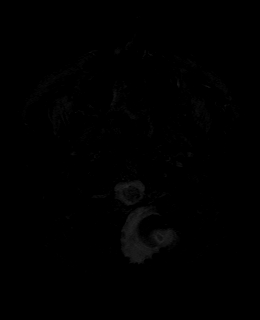

[Series 11: FLAIR · axial · 3.0mm · 0.75mm/px · z∈[-23,+126]mm · 3 of 52 slices shown]
[im 1/52]
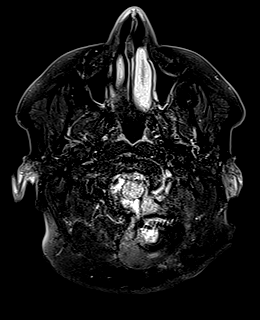
[im 26/52]
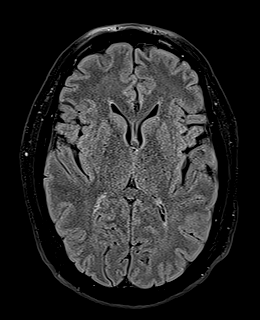
[im 52/52]
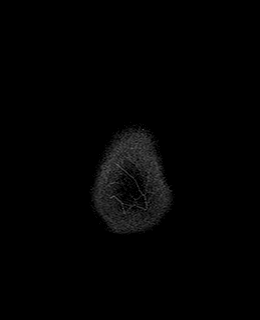

[Series 12: T1 · axial · 1.0mm · 0.94mm/px · z∈[-19,+120]mm · 9 of 144 slices shown (2 of 2)]
[im 1/144]
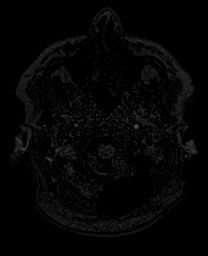
[im 18/144]
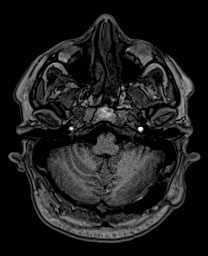
[im 36/144]
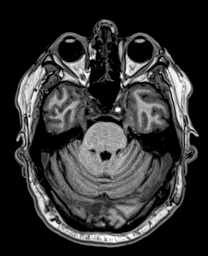
[im 54/144]
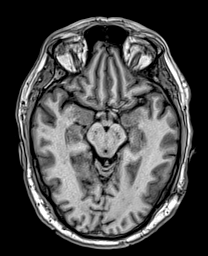
[im 72/144]
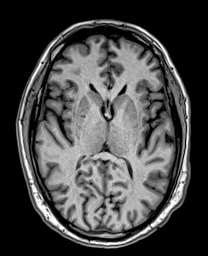
[im 90/144]
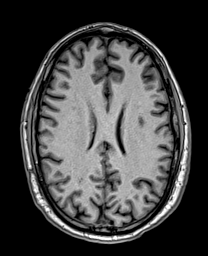
[im 108/144]
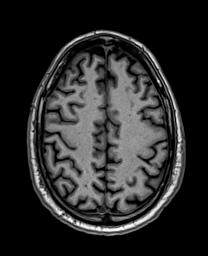
[im 126/144]
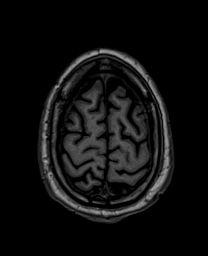
[im 144/144]
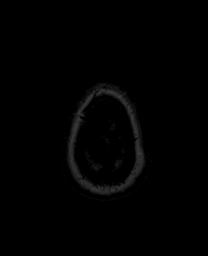

[Series 13: cor dwi_tracew · coronal · 5.0mm · 1.53mm/px · 4 of 62 slices shown]
[im 1/62]
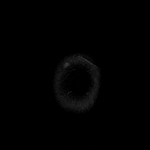
[im 21/62]
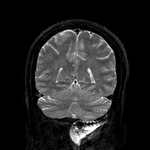
[im 41/62]
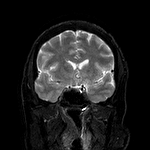
[im 62/62]
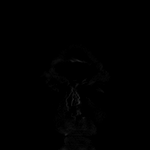

[Series 14: cor dwi_adc · coronal · 5.0mm · 1.53mm/px · 2 of 31 slices shown]
[im 1/31]
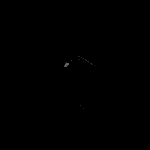
[im 31/31]
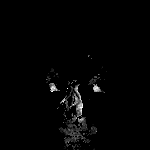

[Series 15: T2 post-contrast · coronal · 5.0mm · 0.69mm/px · 2 of 32 slices shown]
[im 1/32]
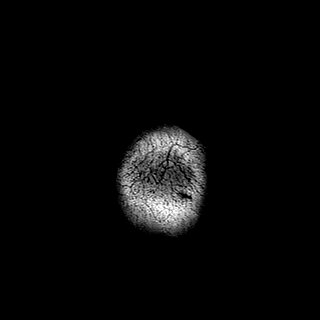
[im 32/32]
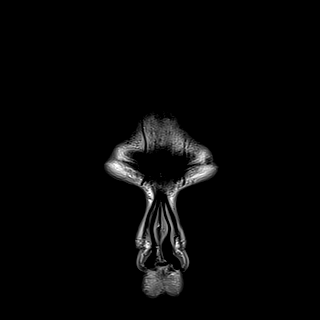

[Series 16: T1 post-contrast · axial · 1.0mm · 0.94mm/px · z∈[-19,+120]mm · 9 of 144 slices shown (1 of 3)]
[im 1/144]
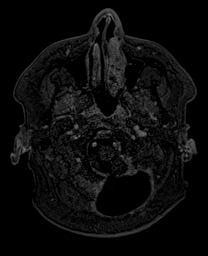
[im 18/144]
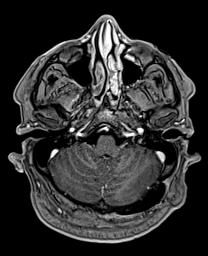
[im 36/144]
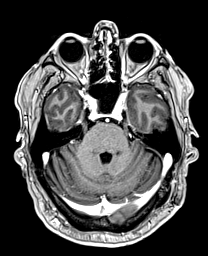
[im 54/144]
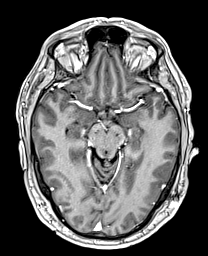
[im 72/144]
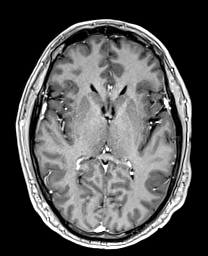
[im 90/144]
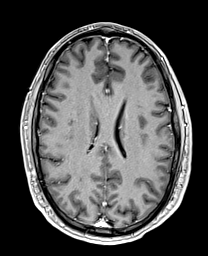
[im 108/144]
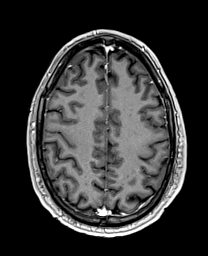
[im 126/144]
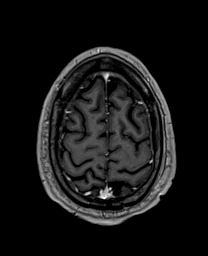
[im 144/144]
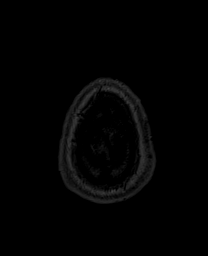

[Series 17: T1 post-contrast · coronal · 5.0mm · 0.43mm/px · 2 of 32 slices shown (2 of 3)]
[im 1/32]
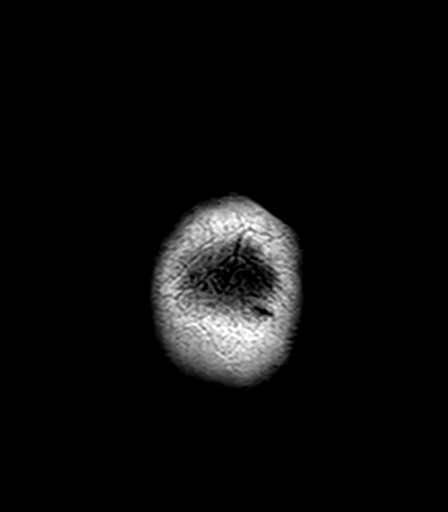
[im 32/32]
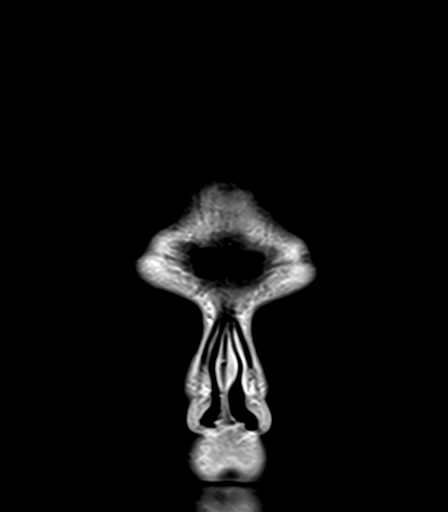

[Series 18: T1 post-contrast · sagittal · 5.0mm · 0.94mm/px · 2 of 26 slices shown (3 of 3)]
[im 1/26]
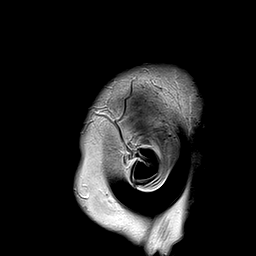
[im 26/26]
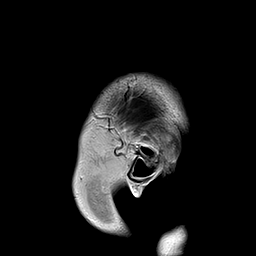

[48 of 48 positions shown; findings below may reference images not displayed]

FINDINGS: Brain: Sequelae of left suboccipital craniectomy and brainstem
biopsy are again identified. The T2 hyperintense lesion in the left
ventral medulla has decreased further in size, measuring
approximately 6 mm, and there is no significant residual
enhancement. Scattered punctate foci of susceptibility artifact and
intrinsic T1 hyperintensity in the lateral ventricles and basilar
cisterns were also present on the prior MRI and are of uncertain
etiology but may reflect postoperative blood products. The small
subdural collection dorsal to the clivus on the prior MRI has
resolved. No acute infarct or midline shift is identified. There is
no hydrocephalus. Dilated perivascular spaces are again noted in the
right cerebral hemispheric white matter.

Vascular: Major intracranial vascular flow voids are preserved.

Skull and upper cervical spine: Left suboccipital craniectomy with
decreased size of the overlying fluid collection which now measures
5.0 x 2.9 x 8.8 cm.

Sinuses/Orbits: Old medial right orbital fracture. Minimal mucosal
thickening in the paranasal sinuses. Clear mastoid air cells.

Other: None.
IMPRESSION: 1. Decreased size of the medullary lesion without residual
enhancement.
2. No acute intracranial abnormality.

## 2021-03-28 MED ORDER — GADOBUTROL 1 MMOL/ML IV SOLN
10.0000 mL | Freq: Once | INTRAVENOUS | Status: AC | PRN
  Administered 2021-03-28: 10 mL via INTRAVENOUS

## 2021-04-01 ENCOUNTER — Inpatient Hospital Stay: Payer: Medicaid Other | Attending: Internal Medicine | Admitting: Internal Medicine

## 2021-04-01 ENCOUNTER — Inpatient Hospital Stay: Payer: Medicaid Other

## 2021-04-01 ENCOUNTER — Other Ambulatory Visit: Payer: Self-pay

## 2021-04-01 VITALS — BP 133/83 | HR 62 | Temp 97.2°F | Resp 18 | Wt 213.5 lb

## 2021-04-01 DIAGNOSIS — G9389 Other specified disorders of brain: Secondary | ICD-10-CM | POA: Diagnosis not present

## 2021-04-01 DIAGNOSIS — Z79899 Other long term (current) drug therapy: Secondary | ICD-10-CM | POA: Insufficient documentation

## 2021-04-01 DIAGNOSIS — Z86718 Personal history of other venous thrombosis and embolism: Secondary | ICD-10-CM | POA: Diagnosis not present

## 2021-04-01 DIAGNOSIS — Z7901 Long term (current) use of anticoagulants: Secondary | ICD-10-CM | POA: Diagnosis not present

## 2021-04-01 DIAGNOSIS — I10 Essential (primary) hypertension: Secondary | ICD-10-CM | POA: Diagnosis not present

## 2021-04-01 NOTE — Progress Notes (Signed)
[facility] at [place], [place] [address] [phone]   Interval Evaluation  Date of Service: 04/01/21 Patient Name: Joshua Swanson Patient MRN: [id number] Patient DOB: [birth year] Provider: Ventura Sellers, MD  Identifying Statement:  Joshua Swanson is a 51 y.o. male with  brainstem   inflammatory mass   \ Oncologic History: 11/28/20: Craniotomy, biopsy with Dr. Zada Finders; path shows inflammatory tissue only  Interval History: Joshua Swanson presents today for follow up after recent MRI brain.  He describes no new or progressive deficits.  No recurrence of right sided numbness.  PCP uncovered a DVT in the left leg, this is being treated with Eliquis.  Denies headaches, seizures.  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  H+P (12/27/20) Patient had experienced 2-3 weeks history of right sided numbness affecting his face, arm, and leg.  There was no issues with gait, speech, no headaches or seizures.  CNS imaging done at [facility] demonstrated a brainstem mass, and prompted biopsy by Dr. Zada Finders on 11/28/20.  Path demonstrated inflammatory cells only, and post-op MRI showed improvement in mass effect and enhancing burden.  Per patient and available records, he had normal metastatic workup (CTs) and never received any steroids.  Medications: Current Outpatient Medications on File Prior to Visit  Medication Sig Dispense Refill   Cholecalciferol (VITAMIN D) 125 MCG (5000 UT) CAPS Take 5,000 Units by mouth daily.     docusate sodium (COLACE) 100 MG capsule Take 1 capsule (100 mg total) by mouth 2 (two) times daily. 10 capsule 0   ELIQUIS 5 MG TABS tablet Take 1 tablet by mouth 2 (two) times daily.     gabapentin (NEURONTIN) 100 MG capsule TAKE ONE CAPSULE BY MOUTH EVERY EIGHT HOURS AS NEEDED 60 capsule 0   losartan (COZAAR) 50 MG tablet Take 50 mg by mouth daily.     Multiple Vitamins-Minerals (ZINC PO) Take 27 mg by mouth daily.      prochlorperazine (COMPAZINE) 10 MG tablet Take 1 tablet (10 mg total) by mouth every 6 (six) hours as needed for nausea or vomiting. 60 tablet 1   simvastatin (ZOCOR) 20 MG tablet Take 20 mg by mouth at bedtime.     Current Facility-Administered Medications on File Prior to Visit  Medication Dose Route Frequency Provider Last Rate Last Admin   0.9 %  sodium chloride infusion   Intravenous PRN Townsend Roger, MD        Allergies: No Known Allergies Past Medical History:  Past Medical History:  Diagnosis Date   Hypertension    Past Surgical History:  Past Surgical History:  Procedure Laterality Date   APPLICATION OF CRANIAL NAVIGATION N/A 11/27/2020   Procedure: APPLICATION OF CRANIAL NAVIGATION;  Surgeon: Judith Part, MD;  Location: [facility];  Service: Neurosurgery;  Laterality: N/A;   CRANIOTOMY Left 11/27/2020   Procedure: Left Far Lateral Craniotomy for Tumor Resection;  Surgeon: Judith Part, MD;  Location: [facility];  Service: Neurosurgery;  Laterality: Left;   FRACTURE SURGERY     Social History:  Social History   Socioeconomic History   Marital status: Married    Spouse name: Not on file   Number of children: Not on file   Years of education: Not on file   Highest education level: Not on file  Occupational History   Not on file  Tobacco Use   Smoking status: Never   Smokeless tobacco: Never  Vaping Use   Vaping  Use: Never used  Substance and Sexual Activity   Alcohol use: Not on file    Comment: occasionally   Drug use: Not on file   Sexual activity: Not on file  Other Topics Concern   Not on file  Social History Narrative   Not on file   Social Determinants of Health   Financial Resource Strain: Not on file  Food Insecurity: Not on file  Transportation Needs: Not on file  Physical Activity: Not on file  Stress: Not on file  Social Connections: Not on file  Intimate Partner Violence: Not on file   Family History: No family history on  file.  Review of Systems: Constitutional: Doesn't report fevers, chills or abnormal weight loss Eyes: Doesn't report blurriness of vision Ears, nose, mouth, throat, and face: Doesn't report sore throat Respiratory: Doesn't report cough, dyspnea or wheezes Cardiovascular: Doesn't report palpitation, chest discomfort  Gastrointestinal:  Doesn't report nausea, constipation, diarrhea GU: Doesn't report incontinence Skin: Doesn't report skin rashes Neurological: Per HPI Musculoskeletal: Doesn't report joint pain Behavioral/Psych: Doesn't report anxiety  Physical Exam: Vitals:   04/01/21 1013  BP: 133/83  Pulse: 62  Resp: 18  Temp: (!) 97.2 F (36.2 C)  SpO2: 98%   KPS: 90. General: Alert, cooperative, pleasant, in no acute distress Head: Normal EENT: No conjunctival injection or scleral icterus.  Lungs: Resp effort normal Cardiac: Regular rate Abdomen: Non-distended abdomen Skin: No rashes cyanosis or petechiae. Extremities: Limited ROM R shoulder  Neurologic Exam: Mental Status: Awake, alert, attentive to examiner. Oriented to self and environment. Language is fluent with intact comprehension.  Cranial Nerves: Visual acuity is grossly normal. Visual fields are full. Extra-ocular movements intact. No ptosis. Face is symmetric Motor: Tone and bulk are normal. Power is full in both arms and legs. Reflexes are symmetric, no pathologic reflexes present.  Sensory: Intact to light touch Gait: Normal.   Labs: I have reviewed the data as listed    Component Value Date/Time   NA 137 11/22/2020 1000   K 3.8 11/22/2020 1000   CL 106 11/22/2020 1000   CO2 22 11/22/2020 1000   GLUCOSE 109 (H) 11/22/2020 1000   BUN 10 11/22/2020 1000   CREATININE 0.96 11/27/2020 1545   CALCIUM 9.3 11/22/2020 1000   GFRNONAA >60 11/27/2020 1545   Lab Results  Component Value Date   WBC 9.7 11/27/2020   HGB 13.5 11/27/2020   HCT 37.6 (L) 11/27/2020   MCV 88.3 11/27/2020   PLT 215 11/27/2020     Imaging: [facility] Clinician Interpretation: I have personally reviewed the CNS images as listed.  My interpretation, in the context of the patient's clinical presentation, is stable disease  MR BRAIN W WO CONTRAST  Result Date: 03/29/2021 CLINICAL DATA:  Brain/CNS neoplasm, assess treatment response. Biopsy in 11/2020 showing inflammatory tissue. EXAM: MRI HEAD WITHOUT AND WITH CONTRAST TECHNIQUE: Multiplanar, multiecho pulse sequences of the brain and surrounding structures were obtained without and with intravenous contrast. CONTRAST:  89mL GADAVIST GADOBUTROL 1 MMOL/ML IV SOLN COMPARISON:  Head MRI 11/28/2020 FINDINGS: Brain: Sequelae of left suboccipital craniectomy and brainstem biopsy are again identified. The T2 hyperintense lesion in the left ventral medulla has decreased further in size, measuring approximately 6 mm, and there is no significant residual enhancement. Scattered punctate foci of susceptibility artifact and intrinsic T1 hyperintensity in the lateral ventricles and basilar cisterns were also present on the prior MRI and are of uncertain etiology but may reflect postoperative blood products. The small subdural collection dorsal  to the clivus on the prior MRI has resolved. No acute infarct or midline shift is identified. There is no hydrocephalus. Dilated perivascular spaces are again noted in the right cerebral hemispheric white matter. Vascular: Major intracranial vascular flow voids are preserved. Skull and upper cervical spine: Left suboccipital craniectomy with decreased size of the overlying fluid collection which now measures 5.0 x 2.9 x 8.8 cm. Sinuses/Orbits: Old medial right orbital fracture. Minimal mucosal thickening in the paranasal sinuses. Clear mastoid air cells. Other: None. IMPRESSION: 1. Decreased size of the medullary lesion without residual enhancement. 2. No acute intracranial abnormality. Electronically Signed   By: Logan Bores M.D.   On: 03/29/2021 15:39      Assessment/Plan Brain mass  Joshua Swanson is clinically and radiographically stable today.  Serologic workup was reviewed. Again, he does not appear to fall neatly into any neuro-immunologic syndrome.  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   We appreciate the opportunity to participate in the care of Joshua Swanson.    We ask that Joshua Swanson return to clinic in 6 months following next brain MRI, or sooner as needed.  All questions were answered. The patient knows to call the clinic with any problems, questions or concerns. No barriers to learning were detected.  The total time spent in the encounter was 30 minutes and more than 50% was on counseling and review of test results   Ventura Sellers, MD Medical Director of Neuro-Oncology [facility] [place] 04/01/21 10:26 AM

## 2021-04-02 ENCOUNTER — Other Ambulatory Visit: Payer: Self-pay | Admitting: Radiation Therapy

## 2021-04-02 ENCOUNTER — Telehealth: Payer: Self-pay | Admitting: Internal Medicine

## 2021-04-02 NOTE — Telephone Encounter (Signed)
Scheduled per 1/30 los, pt wife has been called and confirmed appt

## 2021-04-18 ENCOUNTER — Other Ambulatory Visit: Payer: Self-pay | Admitting: Internal Medicine

## 2021-06-20 ENCOUNTER — Other Ambulatory Visit: Payer: Self-pay | Admitting: Internal Medicine

## 2021-09-03 ENCOUNTER — Other Ambulatory Visit: Payer: Self-pay | Admitting: Internal Medicine

## 2021-09-26 ENCOUNTER — Telehealth: Payer: Self-pay | Admitting: Internal Medicine

## 2021-09-26 NOTE — Telephone Encounter (Signed)
R/s pt's appt time. Spoke to pt;s wife who is aware of new appt time.

## 2021-09-28 ENCOUNTER — Ambulatory Visit (HOSPITAL_COMMUNITY): Payer: Medicaid Other

## 2021-09-29 ENCOUNTER — Encounter (HOSPITAL_COMMUNITY): Payer: Self-pay

## 2021-09-29 ENCOUNTER — Ambulatory Visit (HOSPITAL_COMMUNITY): Admission: RE | Admit: 2021-09-29 | Payer: Medicaid Other | Source: Ambulatory Visit

## 2021-09-30 ENCOUNTER — Inpatient Hospital Stay: Payer: Medicaid Other | Admitting: Internal Medicine

## 2021-10-01 ENCOUNTER — Ambulatory Visit (HOSPITAL_COMMUNITY)
Admission: RE | Admit: 2021-10-01 | Discharge: 2021-10-01 | Disposition: A | Discharge status: Home / Self Care | Payer: Medicaid Other | Source: Ambulatory Visit | Attending: Internal Medicine | Admitting: Internal Medicine

## 2021-10-01 DIAGNOSIS — G9389 Other specified disorders of brain: Secondary | ICD-10-CM | POA: Insufficient documentation

## 2021-10-01 MED ORDER — GADOBUTROL 1 MMOL/ML IV SOLN
9.0000 mL | Freq: Once | INTRAVENOUS | Status: AC | PRN
  Administered 2021-10-01: 9 mL via INTRAVENOUS

## 2021-10-14 ENCOUNTER — Other Ambulatory Visit: Payer: Self-pay

## 2021-10-14 ENCOUNTER — Inpatient Hospital Stay: Payer: Medicaid Other | Attending: Internal Medicine | Admitting: Internal Medicine

## 2021-10-14 ENCOUNTER — Inpatient Hospital Stay: Payer: Medicaid Other

## 2021-10-14 VITALS — BP 128/81 | HR 50 | Temp 97.7°F | Resp 20 | Wt 214.0 lb

## 2021-10-14 DIAGNOSIS — G96198 Other disorders of meninges, not elsewhere classified: Secondary | ICD-10-CM | POA: Diagnosis present

## 2021-10-14 DIAGNOSIS — G9389 Other specified disorders of brain: Secondary | ICD-10-CM | POA: Diagnosis not present

## 2021-10-14 NOTE — Progress Notes (Signed)
[facility] [place], [place] [address] [phone]   Interval Evaluation  Date of Service: 10/14/21 Patient Name: Joshua Swanson Patient MRN: [id number] Patient DOB: 09-11-1970 Provider: Ventura Sellers, MD  Identifying Statement:  Joshua Swanson is a 51 y.o. male with  brainstem   inflammatory mass    Oncologic History: 11/28/20: Craniotomy, biopsy with Dr. Zada Finders; path shows inflammatory tissue only  Interval History: Joshua Swanson presents today for follow up after recent MRI brain.  No new or progressive changes today.  Denies headaches, seizures.  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  H+P (12/27/20) Patient had experienced 2-3 weeks history of right sided numbness affecting his face, arm, and leg.  There was no issues with gait, speech, no headaches or seizures.  CNS imaging done at [facility] demonstrated a brainstem mass, and prompted biopsy by Dr. Zada Finders on 11/28/20.  Path demonstrated inflammatory cells only, and post-op MRI showed improvement in mass effect and enhancing burden.  Per patient and available records, he had normal metastatic workup (CTs) and never received any steroids.  Medications: Current Outpatient Medications on File Prior to Visit  Medication Sig Dispense Refill   Cholecalciferol (VITAMIN D) 125 MCG (5000 UT) CAPS Take 5,000 Units by mouth daily.     docusate sodium (COLACE) 100 MG capsule Take 1 capsule (100 mg total) by mouth 2 (two) times daily. 10 capsule 0   ELIQUIS 5 MG TABS tablet Take 1 tablet by mouth 2 (two) times daily.     gabapentin (NEURONTIN) 100 MG capsule TAKE ONE CAPSULE BY MOUTH EVERY 8 HOURS AS NEEDED 60 capsule 0   losartan (COZAAR) 50 MG tablet Take 50 mg by mouth daily.     Multiple Vitamins-Minerals (ZINC PO) Take 27 mg by mouth daily.     prochlorperazine (COMPAZINE) 10 MG tablet Take 1 tablet (10 mg total) by mouth every 6 (six) hours as needed for nausea or  vomiting. 60 tablet 1   simvastatin (ZOCOR) 20 MG tablet Take 20 mg by mouth at bedtime.     Current Facility-Administered Medications on File Prior to Visit  Medication Dose Route Frequency Provider Last Rate Last Admin   0.9 %  sodium chloride infusion   Intravenous PRN Townsend Roger, MD        Allergies: No Known Allergies Past Medical History:  Past Medical History:  Diagnosis Date   Hypertension    Past Surgical History:  Past Surgical History:  Procedure Laterality Date   APPLICATION OF CRANIAL NAVIGATION N/A 11/27/2020   Procedure: APPLICATION OF CRANIAL NAVIGATION;  Surgeon: Judith Part, MD;  Location: [place];  Service: Neurosurgery;  Laterality: N/A;   CRANIOTOMY Left 11/27/2020   Procedure: Left Far Lateral Craniotomy for Tumor Resection;  Surgeon: Judith Part, MD;  Location: [place];  Service: Neurosurgery;  Laterality: Left;   FRACTURE SURGERY     Social History:  Social History   Socioeconomic History   Marital status: Married    Spouse name: Not on file   Number of children: Not on file   Years of education: Not on file   Highest education level: Not on file  Occupational History   Not on file  Tobacco Use   Smoking status: Never   Smokeless tobacco: Never  Vaping Use   Vaping Use: Never used  Substance and Sexual Activity   Alcohol use: Not on file    Comment: occasionally   Drug  use: Not on file   Sexual activity: Not on file  Other Topics Concern   Not on file  Social History Narrative   Not on file   Social Determinants of Health   Financial Resource Strain: Not on file  Food Insecurity: Not on file  Transportation Needs: Not on file  Physical Activity: Not on file  Stress: Not on file  Social Connections: Not on file  Intimate Partner Violence: Not on file   Family History: No family history on file.  Review of Systems: Constitutional: Doesn't report fevers, chills or abnormal weight loss Eyes: Doesn't report blurriness of  vision Ears, nose, mouth, throat, and face: Doesn't report sore throat Respiratory: Doesn't report cough, dyspnea or wheezes Cardiovascular: Doesn't report palpitation, chest discomfort  Gastrointestinal:  Doesn't report nausea, constipation, diarrhea GU: Doesn't report incontinence Skin: Doesn't report skin rashes Neurological: Per HPI Musculoskeletal: Doesn't report joint pain Behavioral/Psych: Doesn't report anxiety  Physical Exam: There were no vitals filed for this visit.  KPS: 90. General: Alert, cooperative, pleasant, in no acute distress Head: Normal EENT: No conjunctival injection or scleral icterus.  Lungs: Resp effort normal Cardiac: Regular rate Abdomen: Non-distended abdomen Skin: No rashes cyanosis or petechiae. Extremities: Limited ROM R shoulder  Neurologic Exam: Mental Status: Awake, alert, attentive to examiner. Oriented to self and environment. Language is fluent with intact comprehension.  Cranial Nerves: Visual acuity is grossly normal. Visual fields are full. Extra-ocular movements intact. No ptosis. Face is symmetric Motor: Tone and bulk are normal. Power is full in both arms and legs. Reflexes are symmetric, no pathologic reflexes present.  Sensory: Intact to light touch Gait: Normal.   Labs: I have reviewed the data as listed    Component Value Date/Time   NA 137 11/22/2020 1000   K 3.8 11/22/2020 1000   CL 106 11/22/2020 1000   CO2 22 11/22/2020 1000   GLUCOSE 109 (H) 11/22/2020 1000   BUN 10 11/22/2020 1000   CREATININE 0.96 11/27/2020 1545   CALCIUM 9.3 11/22/2020 1000   GFRNONAA >60 11/27/2020 1545   Lab Results  Component Value Date   WBC 9.7 11/27/2020   HGB 13.5 11/27/2020   HCT 37.6 (L) 11/27/2020   MCV 88.3 11/27/2020   PLT 215 11/27/2020    Imaging: [facility] Clinician Interpretation: I have personally reviewed the CNS images as listed.  My interpretation, in the context of the patient's clinical presentation, is stable  disease  MR BRAIN W WO CONTRAST  Result Date: 10/02/2021 CLINICAL DATA:  Brain/CNS neoplasm, assess treatment response EXAM: MRI HEAD WITHOUT AND WITH CONTRAST TECHNIQUE: Multiplanar, multiecho pulse sequences of the brain and surrounding structures were obtained without and with intravenous contrast. CONTRAST:  15m GADAVIST GADOBUTROL 1 MMOL/ML IV SOLN COMPARISON:  MRI 03/28/2021. FINDINGS: Brain: Similar versus slightly decreased conspicuity of the FLAIR hyperintense nonenhancing lesion in the left anterior medulla. Redemonstrated sequela left suboccipital craniectomy and brainstem biopsy. Similar overlying pseudomeningocele, described below. Redemonstrated scattered punctate foci of susceptibility artifact and intrinsic T1 hyperintensity lateral ventricles and basal cisterns which are of uncertain etiology, potentially prior blood products. No evidence of acute infarct, midline shift, hydrocephalus, or new mass lesion. Vascular: Major arterial flow voids are maintained. Skull and upper cervical spine: Left suboccipital craniectomy with similar size the overlying fluid collection. Sinuses/Orbits: Clear sinuses.  No acute orbital findings. Other: No mastoid effusions. IMPRESSION: 1. Similar versus slightly decreased conspicuity of the nonenhancing lesion in the left anterior medulla. 2. 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 3. No acute abnormality. Electronically Signed   By: Margaretha Sheffield M.D.   On: 10/02/2021 08:52     Assessment/Plan Brain mass  Geovannie Vilar is clinically and radiographically stable today.  Medullary lesion has decreased in signal abnormality on FLAIR without any specific intervention.  As prior, he does not appear to fall neatly into any neuro-immunologic syndrome.  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   We appreciate the opportunity to participate in the care of Levent Kornegay.     We ask that Jacques Fife return to clinic as needed with new or progressive neurologic symptoms.  All questions were answered. The patient knows to call the clinic with any problems, questions or concerns. No barriers to learning were detected.  The total time spent in the encounter was 30 minutes and more than 50% was on counseling and review of test results   Ventura Sellers, MD Medical Director of Neuro-Oncology [facility] [place] 10/14/21 9:32 AM

## 2021-11-18 ENCOUNTER — Other Ambulatory Visit: Payer: Self-pay | Admitting: Internal Medicine

## 2022-03-15 ENCOUNTER — Other Ambulatory Visit: Payer: Self-pay | Admitting: Internal Medicine

## 2022-05-17 ENCOUNTER — Other Ambulatory Visit: Payer: Self-pay | Admitting: Internal Medicine

## 2022-07-21 ENCOUNTER — Other Ambulatory Visit: Payer: Self-pay | Admitting: Internal Medicine

## 2022-09-23 ENCOUNTER — Other Ambulatory Visit: Payer: Self-pay | Admitting: Internal Medicine
